# Patient Record
Sex: Female | Born: 1945
Health system: Southern US, Community
[De-identification: ages and names within clinical notes are randomized; demographics above are authoritative.]

## PROBLEM LIST (undated history)

## (undated) DIAGNOSIS — M1990 Unspecified osteoarthritis, unspecified site: Secondary | ICD-10-CM

## (undated) DIAGNOSIS — K219 Gastro-esophageal reflux disease without esophagitis: Secondary | ICD-10-CM

## (undated) DIAGNOSIS — I10 Essential (primary) hypertension: Secondary | ICD-10-CM

## (undated) DIAGNOSIS — F329 Major depressive disorder, single episode, unspecified: Secondary | ICD-10-CM

## (undated) DIAGNOSIS — M6530 Trigger finger, unspecified finger: Secondary | ICD-10-CM

## (undated) DIAGNOSIS — F32A Depression, unspecified: Secondary | ICD-10-CM

## (undated) DIAGNOSIS — F419 Anxiety disorder, unspecified: Secondary | ICD-10-CM

## (undated) HISTORY — PX: COLONOSCOPY: SHX174

## (undated) HISTORY — PX: BREAST LUMPECTOMY: SHX2

## (undated) HISTORY — PX: BREAST EXCISIONAL BIOPSY: SUR124

## (undated) HISTORY — PX: KNEE ARTHROSCOPY: SHX127

## (undated) HISTORY — PX: ABDOMINAL HYSTERECTOMY: SHX81

## (undated) HISTORY — PX: TONSILLECTOMY: SUR1361

---

## 1999-05-24 ENCOUNTER — Other Ambulatory Visit: Admission: RE | Admit: 1999-05-24 | Discharge: 1999-05-24 | Payer: Self-pay | Admitting: *Deleted

## 1999-06-02 ENCOUNTER — Ambulatory Visit (HOSPITAL_COMMUNITY): Admission: RE | Admit: 1999-06-02 | Discharge: 1999-06-02 | Payer: Self-pay | Admitting: *Deleted

## 1999-06-02 ENCOUNTER — Encounter: Payer: Self-pay | Admitting: *Deleted

## 1999-11-06 ENCOUNTER — Ambulatory Visit (HOSPITAL_COMMUNITY): Admission: RE | Admit: 1999-11-06 | Discharge: 1999-11-06 | Payer: Self-pay | Admitting: Family Medicine

## 1999-11-06 ENCOUNTER — Encounter: Payer: Self-pay | Admitting: Family Medicine

## 2000-11-21 ENCOUNTER — Other Ambulatory Visit: Admission: RE | Admit: 2000-11-21 | Discharge: 2000-11-21 | Payer: Self-pay | Admitting: *Deleted

## 2001-05-20 ENCOUNTER — Ambulatory Visit (HOSPITAL_COMMUNITY): Admission: RE | Admit: 2001-05-20 | Discharge: 2001-05-20 | Payer: Self-pay | Admitting: Gastroenterology

## 2001-05-20 ENCOUNTER — Encounter (INDEPENDENT_AMBULATORY_CARE_PROVIDER_SITE_OTHER): Payer: Self-pay | Admitting: Specialist

## 2002-02-25 ENCOUNTER — Other Ambulatory Visit: Admission: RE | Admit: 2002-02-25 | Discharge: 2002-02-25 | Payer: Self-pay | Admitting: *Deleted

## 2003-05-07 ENCOUNTER — Ambulatory Visit (HOSPITAL_COMMUNITY): Admission: RE | Admit: 2003-05-07 | Discharge: 2003-05-07 | Payer: Self-pay | Admitting: Gastroenterology

## 2003-05-07 ENCOUNTER — Encounter (INDEPENDENT_AMBULATORY_CARE_PROVIDER_SITE_OTHER): Payer: Self-pay | Admitting: Specialist

## 2003-06-07 ENCOUNTER — Other Ambulatory Visit: Admission: RE | Admit: 2003-06-07 | Discharge: 2003-06-07 | Payer: Self-pay | Admitting: *Deleted

## 2004-06-01 ENCOUNTER — Ambulatory Visit (HOSPITAL_COMMUNITY): Admission: RE | Admit: 2004-06-01 | Discharge: 2004-06-01 | Payer: Self-pay | Admitting: *Deleted

## 2004-06-07 ENCOUNTER — Encounter: Admission: RE | Admit: 2004-06-07 | Discharge: 2004-06-07 | Payer: Self-pay | Admitting: *Deleted

## 2004-06-26 ENCOUNTER — Other Ambulatory Visit: Admission: RE | Admit: 2004-06-26 | Discharge: 2004-06-26 | Payer: Self-pay | Admitting: *Deleted

## 2004-07-14 ENCOUNTER — Ambulatory Visit (HOSPITAL_COMMUNITY): Admission: RE | Admit: 2004-07-14 | Discharge: 2004-07-14 | Payer: Self-pay | Admitting: Gastroenterology

## 2004-07-14 ENCOUNTER — Encounter (INDEPENDENT_AMBULATORY_CARE_PROVIDER_SITE_OTHER): Payer: Self-pay | Admitting: Specialist

## 2004-09-15 ENCOUNTER — Ambulatory Visit (HOSPITAL_COMMUNITY): Admission: RE | Admit: 2004-09-15 | Discharge: 2004-09-15 | Payer: Self-pay | Admitting: Orthopedic Surgery

## 2004-09-15 ENCOUNTER — Ambulatory Visit (HOSPITAL_BASED_OUTPATIENT_CLINIC_OR_DEPARTMENT_OTHER): Admission: RE | Admit: 2004-09-15 | Discharge: 2004-09-15 | Payer: Self-pay | Admitting: Orthopedic Surgery

## 2004-10-02 ENCOUNTER — Ambulatory Visit (HOSPITAL_COMMUNITY): Admission: RE | Admit: 2004-10-02 | Discharge: 2004-10-02 | Payer: Self-pay | Admitting: Orthopedic Surgery

## 2005-06-08 ENCOUNTER — Ambulatory Visit (HOSPITAL_COMMUNITY): Admission: RE | Admit: 2005-06-08 | Discharge: 2005-06-08 | Payer: Self-pay | Admitting: *Deleted

## 2005-06-25 ENCOUNTER — Encounter: Payer: Self-pay | Admitting: *Deleted

## 2005-06-27 ENCOUNTER — Inpatient Hospital Stay (HOSPITAL_COMMUNITY): Admission: AD | Admit: 2005-06-27 | Discharge: 2005-06-30 | Payer: Self-pay | Admitting: *Deleted

## 2005-06-27 ENCOUNTER — Encounter (INDEPENDENT_AMBULATORY_CARE_PROVIDER_SITE_OTHER): Payer: Self-pay | Admitting: Specialist

## 2006-04-15 ENCOUNTER — Other Ambulatory Visit: Admission: RE | Admit: 2006-04-15 | Discharge: 2006-04-15 | Payer: Self-pay | Admitting: *Deleted

## 2006-07-01 ENCOUNTER — Encounter: Admission: RE | Admit: 2006-07-01 | Discharge: 2006-07-01 | Payer: Self-pay | Admitting: *Deleted

## 2006-12-26 ENCOUNTER — Encounter: Admission: RE | Admit: 2006-12-26 | Discharge: 2006-12-26 | Payer: Self-pay | Admitting: Orthopedic Surgery

## 2007-04-01 ENCOUNTER — Other Ambulatory Visit: Admission: RE | Admit: 2007-04-01 | Discharge: 2007-04-01 | Payer: Self-pay | Admitting: *Deleted

## 2007-06-17 ENCOUNTER — Encounter: Admission: RE | Admit: 2007-06-17 | Discharge: 2007-06-17 | Payer: Self-pay | Admitting: Orthopedic Surgery

## 2007-07-03 ENCOUNTER — Encounter: Admission: RE | Admit: 2007-07-03 | Discharge: 2007-07-03 | Payer: Self-pay | Admitting: *Deleted

## 2007-07-03 ENCOUNTER — Encounter: Admission: RE | Admit: 2007-07-03 | Discharge: 2007-07-03 | Payer: Self-pay | Admitting: Orthopedic Surgery

## 2007-07-30 ENCOUNTER — Encounter: Admission: RE | Admit: 2007-07-30 | Discharge: 2007-07-30 | Payer: Self-pay | Admitting: Orthopedic Surgery

## 2008-03-05 ENCOUNTER — Ambulatory Visit: Admission: RE | Admit: 2008-03-05 | Discharge: 2008-03-05 | Payer: Self-pay | Admitting: Orthopedic Surgery

## 2008-03-05 ENCOUNTER — Ambulatory Visit: Payer: Self-pay | Admitting: Surgery

## 2008-03-05 ENCOUNTER — Encounter (INDEPENDENT_AMBULATORY_CARE_PROVIDER_SITE_OTHER): Payer: Self-pay | Admitting: Orthopedic Surgery

## 2008-07-05 ENCOUNTER — Encounter: Admission: RE | Admit: 2008-07-05 | Discharge: 2008-07-05 | Payer: Self-pay | Admitting: Gynecology

## 2008-07-08 ENCOUNTER — Encounter: Admission: RE | Admit: 2008-07-08 | Discharge: 2008-07-08 | Payer: Self-pay | Admitting: Gynecology

## 2009-07-11 ENCOUNTER — Encounter: Admission: RE | Admit: 2009-07-11 | Discharge: 2009-07-11 | Payer: Self-pay | Admitting: Family Medicine

## 2010-04-27 ENCOUNTER — Encounter: Admission: RE | Admit: 2010-04-27 | Discharge: 2010-04-27 | Payer: Self-pay | Admitting: Family Medicine

## 2010-08-14 ENCOUNTER — Encounter
Admission: RE | Admit: 2010-08-14 | Discharge: 2010-08-14 | Payer: Self-pay | Source: Home / Self Care | Attending: Family Medicine | Admitting: Family Medicine

## 2010-09-16 ENCOUNTER — Encounter: Payer: Self-pay | Admitting: *Deleted

## 2010-09-17 ENCOUNTER — Encounter: Payer: Self-pay | Admitting: Gynecology

## 2010-09-17 ENCOUNTER — Encounter: Payer: Self-pay | Admitting: Family Medicine

## 2010-09-17 ENCOUNTER — Encounter: Payer: Self-pay | Admitting: Orthopedic Surgery

## 2011-01-12 NOTE — Op Note (Signed)
NAME:  Kimberly [name], Kimberly [name]                    ACCOUNT NO.:  [id number]   MEDICAL RECORD NO.:  [id number]                   PATIENT TYPE:  AMB   LOCATION:  ENDO                                 FACILITY:  [facility]   PHYSICIAN:  Petra Kuba, M.D.                 DATE OF BIRTH:  April 15, 1946   DATE OF PROCEDURE:  05/07/2003  DATE OF DISCHARGE:                                 OPERATIVE REPORT   PROCEDURE:  EGD with biopsy.   INDICATION:  The patient with atypical reflux.  Want to rule out Barrett's  or significant esophagitis.  Consent was signed after risks, benefits,  methods, options thoroughly discussed in the office.   MEDICINES USED:  1. Fentanyl 200 mcg.  2. Versed 10 mg.   DESCRIPTION OF PROCEDURE:  Video endoscope was inserted by direct vision.  A  quick look at the vocal cords was normal.  Maybe there was a touch or  erythema around them.  The scope passed into the esophagus which was normal  and advanced to the distal esophagus.  Possibly a tiny hiatal hernia was  seen.  The scope passed into the stomach, advanced in the antrum.  There was  some minimal antritis as well.  Scope passed into a normal pylorus, into a  normal duodenal bulb, and around the C-loop to a normal second portion of  the duodenum.  The scope was withdrawn back to the bulb.  A good look there  ruled out abnormalities in that location.  The scope was withdrawn back to  the stomach and retroflexed.  Angularis, cardia, fundus, greater and lesser  curve were all normal.  Straight visualization in the stomach was normal  except for the mild antritis.  Two biopsies of the antrum and two of the  proximal stomach were obtained to rule out Helicobacter.  Scope was slowly  withdrawn back to 20 cm.  No addition esophageal findings were seen, then  advanced to the distal esophagus, and a few biopsies of that area were  obtained to rule out any microscopic reflux problem.  The scope was then  slowly withdrawn.  The  patient tolerated the procedure well.  There was no  obvious immediate complication.   ENDOSCOPIC DIAGNOSES:  1. Tiny hiatal hernia.  2. Minimal antritis, status post biopsy of that and the proximal stomach.  3. Otherwise normal EGD, status post distal esophageal biopsies.    PLAN:  1. We will go ahead and double her Nexium.  2. Follow up in two months and await path in the meantime.  3. Call me sooner p.r.n.                                               Petra Kuba, M.D.    MEM/MEDQ  D:  05/07/2003  T:  05/07/2003  Job:  [id number]   cc:   Jetty Duhamel. at [facility] B. Sherene Sires, M.D. [facility]

## 2011-01-12 NOTE — Op Note (Signed)
NAMEGALILEE, Kimberly [name]          ACCOUNT NO.:  [id number]   MEDICAL RECORD NO.:  [id number]          PATIENT TYPE:  AMB   LOCATION:  ENDO                         FACILITY:  [facility]   PHYSICIAN:  Petra Kuba, M.D.    DATE OF BIRTH:  1946/03/22   DATE OF PROCEDURE:  07/14/2004  DATE OF DISCHARGE:                                 OPERATIVE REPORT   PROCEDURE:  Colonoscopy with biopsy.   INDICATION:  Family history of colon cancer.  __________ due for colonic  screening.  Consent was signed after risks, benefits, methods, options  thoroughly discussed multiple times in the past.   MEDICINES USED:  1.  Demerol 80.  2.  Versed 9.   DESCRIPTION OF PROCEDURE:  Rectal inspection was pertinent for external  hemorrhoids.  Digital exam was negative.  The video pediatric adjustable  colonoscope was inserted, easily advanced around the colon to the cecum,  which required rolling her on her back and some abdominal pressure.  Other  than some left and right diverticula, three tiny left-sided and transverse  probable hyperplastic-appearing polyps were seen on insertion and were all  cold biopsied and put in the first container.  The cecum was identified by  the appendiceal orifice and the ileocecal valve.  The prep was adequate.  There was some liquid stool that required washing and suctioning on slow  withdrawal through the colon.  The cecum and the ascending were normal.  As  we withdrew around the left side of the colon, the polyps that were seen on  insertion sites were seen.  If there was any residual polypoid tissue seen,  an extra biopsy was obtained.  A few other hyperplastic-appearing left-sided  polyps were seen and were also cold biopsied.  None of the polyps appeared  worrisome or anything but hyperplastic, so they were all cold biopsied and  put in the same container.  There was a rare left-sided and right-sided  diverticula but no other abnormalities.  Once back in the rectum,  anorectal  pull-through and retroflexion confirmed some small hemorrhoids.  The scope  was straightened and readvanced a short ways up the left side of the colon;  air was suctioned and scope removed.  The patient tolerated the procedure  well.  There was no obvious immediate complication.   ENDOSCOPIC DIAGNOSES:  1.  Internal-external small hemorrhoids.  2.  Scattered occasional left and right diverticula.  3.  Few left-sided and transverse hyperplastic-appearing polyps, cold      biopsied.  4.  Otherwise within normal limits to the cecum.   PLAN:  1.  Await pathology to determine future colonic screening.  2.  Happy to see back p.r.n.  3.  Otherwise, return care to [facility] and Dr. Melba Coon for the customary health care maintenance to include yearly rectals      and guaiacs.      MEM/MEDQ  D:  07/14/2004  T:  07/14/2004  Job:  [id number]   cc:   Chales Salmon. Abigail Miyamoto, M.D.  [address]  [place]  [place] [address]  Fax: [phone]   Almedia Balls. Fore, M.D.  [phone] [address] [place]  [place] [address]  Fax: [phone]

## 2011-01-12 NOTE — Op Note (Signed)
NAMEDENIM, START          ACCOUNT NO.:  [id number]   MEDICAL RECORD NO.:  [id number]          PATIENT TYPE:  INP   LOCATION:  9304                          FACILITY:  [facility]   PHYSICIAN:  Almedia Balls. Fore, M.D.   DATE OF BIRTH:  1945/08/30   DATE OF PROCEDURE:  06/27/2005  DATE OF DISCHARGE:                                 OPERATIVE REPORT   PREOPERATIVE DIAGNOSIS:  Uterovaginal prolapse, cystocele, rectocele,  probable enterocele.   POSTOPERATIVE DIAGNOSIS:  Uterovaginal prolapse, cystocele, rectocele,  probable enterocele. Pending pathology.   OPERATION:  Laparoscopically assisted vaginal hysterectomy, bilateral  salpingo-oophorectomy, anterior and posterior colporrhaphy, repair of  enterocele,  uterosacral ligament vaginal vault suspension.   ANESTHESIA:  General orotracheal.   OPERATOR:  Almedia Balls. Randell Patient, M.D.   FIRST ASSISTANT:  Leona Singleton, M.D.   INDICATIONS FOR SURGERY:  The patient is a 65 year old with the above-noted  problems who was counseled as to the need for surgery to treat these  problems. She was fully counseled as to the nature of the procedure and the  risks involved to include risks of anesthesia, injury to bowel, bladder,  blood vessels, ureters, postoperative hemorrhage, infection, recuperation.  She fully understands all these considerations and has signed informed  consent to proceed on June 27, 2005.   OPERATIVE FINDINGS:  On examination, the uterus was mid posterior normal  size, shape and contour. There were no palpable adnexal masses. There was  descent of the uterus to the level of the introitus. On laparoscopy, the  lower liver edge, area of the gallbladder, spleen, and appendiceal area were  normal. The uterus, tubes and ovaries were normal in the pelvis as well.   PROCEDURE:  With the patient under general anesthesia, prepared and draped  in the usual sterile fashion, a speculum was placed in the vagina, the  cervix was grasped with a  hulka tenaculum. A Foley catheter was placed, and  the bladder was filled with 500 mL of normal saline solution. A Bonanno  suprapubic catheter was placed in the bladder without difficulty and sutured  in place with #0 silk sutures. The Foley catheter was then left to straight  drainage. The patient was then re-prepared and draped for a laparoscopic  procedure. An incision was made in the lower pole of the umbilicus with  insertion of the Veress cannula and insufflation of 3 liters of carbon  dioxide. The disposable 11 mm trocar for the operative scope and the scope  itself were then inserted into the peritoneal cavity. A disposable 5 mm  probe was inserted through a stab wound just above the symphysis pubis above  the area of the suprapubic catheter and well above the reflection of the  bladder. The above-noted findings were visualized. Bipolar  electrocoagulation was used for rendering the infundibulopelvic ligaments  bilaterally hemostatic as well as the  attachments and round ligaments  bilaterally. These structures were sequentially cut free. There was no  bleeding encountered during this portion of the procedure. The bladder flap  was developed on the anterior surface of the uterus with bipolar  electrocoagulation for hemostasis and sharp dissection  for development of  the planes. At this point with good hemostasis in the peritoneal area,  attention was directed to the vaginal portion of the procedure. The patient  was repositioned, and a weighted speculum was placed in the posterior vagina  with the cervix being grasped with two Loman [place]. A solution of 1%  lidocaine with 1:100,000 epinephrine was injected circumferentially on the  cervix for a total of 10 mL for delineation of fascial planes and  hemostasis. An incision was made in the vaginal mucosa anteriorly from the 3  to 9 o'clock position with sharp dissection being utilized to dissect the  bladder off the cervix and  lower uterine segment. This was accomplished  without difficulty, and the anterior cul-de-sac was entered without  difficulty. The posterior cul-de-sac was entered by making an incision  through the vaginal mucosa with entry into the posterior cul-de-sac. Heaney  clamps were used to clamp in succession and bilaterally the uterosacral  ligament, vaginal cuff angles, cardinal ligaments and uterine vessels. The  structures were sequentially cut free and individually suture ligated with  #1 chromic catgut. It was then possible to invert the uterus. Heaney clamps  were used to clamp the remaining portions of cardinal ligaments bilaterally  which were then cut free with removal of the uterus, tubes, and ovaries as a  single specimen. The cardinal ligament clamps were then tied with  interrupted ties of #1 chromic catgut. After inspecting the area and  ensuring that sponge and instrument counts were correct, the peritoneum was  closed with a continuous suture of #0 Vicryl. Attention was directed to the  anterior colporrhaphy. Allis clamps were placed at the vaginal cuff angles  in the upper vagina with other Allis clamps placed in the midline. The  lidocaine epinephrine solution was used in the midline to again dissect the  fascial planes and provide for hemostasis. An incision was made from the  vaginal cuff to the urethrovesical angle. The underlying bladder fascia was  dissected off using sharp and blunt dissection. Imbricating sutures of #0  Vicryl were placed in the urethrovesical angle area with good elevation of  this angle. The cystocele was reduced using imbricated pursestring sutures  and horizontal mattress sutures which provided good elevation of the  bladder. Redundant vaginal mucosa was trimmed, and the vaginal mucosa was  then reapproximated and rendered hemostatic with a continuous interlocking suture of #0 Vicryl. Attention was directed to the rectocele and enterocele  areas  next.   Allis clamps were used to provide elevation for the vaginal mucosa which  again was injected in the midline with the lidocaine epinephrine solution. A  total of 11 mL were used on both the anterior and posterior repairs. The  posterior vaginal mucosa was then incised in the midline from the introitus  to the vaginal cuff. Underlying rectus fascia and levator ani fascia was  dissected free of the vaginal mucosa. The enterocele was encountered in the  upper portion of the dissection plane. The enterocele was then reduced by  imbricating sutures of #0 Vicryl with good resolution of this problem.  Interrupted horizontal mattress sutures were used to reattach the detached  ends of the levator ani fascia. The supporting vaginal fascial areas were  brought together in the midline and using again imbricating sutures, the  rectocele was also reduced. Redundant vaginal mucosa in the posterior wall  was then excised, and the posterior incision was reapproximated and rendered  hemostatic with a continuous interlocking suture  of #0 Vicryl. The upper  vaginal cuff was inspected for hemostasis and small bleeders were rendered  hemostatic with Bovie electrocoagulation. The uterosacral ligament sutures  which had been held long were tied together in the midline. A suture of #0  PDS had been placed in the upper vaginal cuff with incorporation of the  uterosacral ligaments bilaterally for vault suspension. This was then tied  down with good elevation of the vaginal vault. The area was lavaged  with  copious amounts of lactated Ringer solution, and after noting that  hemostasis in this area was maintained and that sponge and instrument counts  was were correct, the patient was repositioned for a laparoscopic procedure.   The laparoscope was reinserted into the trocar which had been left in place.  The operative site in the pelvis was lavaged with copious amounts of  lactated Ringer solution. Small  bleeders were rendered hemostatic with  bipolar electrocoagulation. The area was observed following use of the  bipolar unit for hemostasis, and after noting this was maintained and that  sponge and instrument counts were correct, the laparoscope and lower trocars  were removed from the peritoneal cavity while allowing gas to escape. The  incisions were closed with fascial sutures of #0 Vicryl  and subcuticular sutures of 3-0 plain catgut. Estimated blood loss for the  entire procedure 150 mL. The patient was taken to the recovery room in good  condition with clear urine in the suprapubic catheter tubing. She will be  admitted following surgery.           ______________________________  Almedia Balls Randell Patient, M.D.     SRF/MEDQ  D:  06/27/2005  T:  06/27/2005  Job:  [id number]   cc:   Leona Singleton, M.D.  Fax: [phone]

## 2011-01-12 NOTE — Procedures (Signed)
[place]  Patient:    Kimberly [name], Kimberly [name] Visit Number: [id number] MRN: [id number]          Service Type: END Location: ENDO Attending Physician:  Nelda Marseille Dictated by:   Petra Kuba, M.D. Proc. Date: 05/20/01 Admit Date:  05/20/2001   CC:         Chales Salmon. Abigail Miyamoto, M.D.   Procedure Report  PROCEDURE:  Colonoscopy with polypectomy.  INDICATIONS FOR PROCEDURE:  The patient with a bout of gastrointestinal bleeding, questionable etiology, overdue for colonic screening.  Consent was signed after risks, benefits, methods, and options were thoroughly discussed multiple times in the past.  MEDICATIONS USED: 1. Demerol 90 mg. 2. Versed 9 mg.  DESCRIPTION OF PROCEDURE:  Rectal inspection was pertinent for external hemorrhoids.  Digital examination was negative.  Pediatric video colonoscope was inserted, fairly easily advanced around the colon to the cecum.  This did require some left lower quadrant pressure, but no position changes.  Upon insertion, there was some rectal inflammation, but no other abnormalities.  No diverticuli were seen.  The cecum was identified by the appendiceal orifice and the ileocecal valve, in fact, the scope was inserted a short way in the terminal ileum which was normal.  Photo documentation was obtained.  The scope was slowly withdrawn.  The prep was adequate.  There was some liquid stool that required washing and suctioning.  The cecum was normal.  In the proximal ascending, a 2 to 3 mm polyp was seen, snared, electrocautery applied, and the polyp was suctioned through the scope and collected in the trap.  In the more distal ascending, a tiny 2 mm polyp was seen and hot biopsied x 1.  Scope was slowly withdrawn.  In the transverse, a 1 mm polyp was seen and was hot biopsied x 1 as well.  The scope was further withdrawn around the left side of the colon.  The descending colon was normal.  In the distal  sigmoid and rectum, multiple tiny hyperplastic appearing polyps were seen which were hot biopsied and put in the same container.  Back in the rectum, multiple cold biopsies were obtained of the rectal inflammation which was mild.  It actually looked more of a resolving ischemia than any other obvious etiologies.  The scope was then retroflexed revealing some small internal hemorrhoids.  The scope was straightened, readvanced a short ways up the sigmoid, air was suctioned, scope removed.  The patient tolerated the procedure well.  There were no obvious immediate complications.  ENDOSCOPIC DIAGNOSES: 1. Internal external hemorrhoids. 2. Rectal minimal inflammation, status post biopsy. 3. Three ascending and transverse small polyps, one snared, two hot biopsied. 4. Multiple tiny rectal and sigmoid hyperplastic appearing polyps that were    hot biopsied. 5. Otherwise within normal limits to the terminal ileum.  PLAN: 1. Await pathology to determine future colonic screening. 2. Post-polypectomy instructions. 3. Gastrointestinal followup p.r.n. or in 6 to 8 weeks to recheck guaiac    symptoms, and make sure no further workup plans are needed. Dictated by:   Petra Kuba, M.D. Attending Physician:  Nelda Marseille DD:  05/20/01 TD:  05/20/01 Job: [id number]

## 2011-01-12 NOTE — Op Note (Signed)
NAMEJASLINE, Kimberly [name]          ACCOUNT NO.:  [id number]   MEDICAL RECORD NO.:  [id number]          PATIENT TYPE:  AMB   LOCATION:  DSC                          FACILITY:  [facility]   PHYSICIAN:  Thera Flake., M.D.DATE OF BIRTH:  [birth year]   DATE OF PROCEDURE:  09/15/2004  DATE OF DISCHARGE:                                 OPERATIVE REPORT   PREOPERATIVE DIAGNOSIS:  Right knee pain with torn medial meniscus.   POSTOPERATIVE DIAGNOSIS:  Right knee pain with torn medial meniscus, torn  lateral meniscus, osteoarthritis of the patellofemoral joint and medial  compartment.   OPERATION:  1.  Partial medial and lateral meniscectomies.  2.  Debridement chondroplasty of patellofemoral joint medial compartment.   SURGEON:  Dyke Brackett, M.D.   ANESTHESIA:  General.   DESCRIPTION OF PROCEDURE:  Arthroscope through an inferomedial and  inferolateral portal.  Moderate chondromalacia of the patella was identified  with debridement of the patellofemoral joint separate from the medial  compartment.  The medial meniscus showed a complex tear of the posterior  horn of the meniscus requiring resection of about 30-40% of the meniscus  substance with debridement of the condyle, ACL and PCL were normal, the  lateral meniscus showed a small fraying type tear of the lateral meniscus  which was excised.  No significant degenerative arthritis was present  laterally.  The knee was drained free of fluid.  The portals were closed  with nylon.  The knee was infiltrated with Marcaine and morphine and an  additional 40 mg Depo-Medrol.  The patient went to the recovery room in  stable condition.      WDC/MEDQ  D:  09/15/2004  T:  09/15/2004  Job:  [id number]

## 2011-01-12 NOTE — H&P (Signed)
Kimberly [name], Kimberly [name]          ACCOUNT NO.:  [id number]   MEDICAL RECORD NO.:  [id number]           PATIENT TYPE:   LOCATION:                                 FACILITY:   PHYSICIAN:  Almedia Balls. Fore, M.D.   DATE OF BIRTH:  [birth year]   DATE OF ADMISSION:  06/27/2005  DATE OF DISCHARGE:                                HISTORY & PHYSICAL   CHIEF COMPLAINTS:  Something dropping.   HISTORY:  The patient is a 65 year old, gravida 2, para 2 whose last  menstrual period was many years ago. She was seen recently in our office for  examination with a complaint of something hanging out. This has increased  over the past several months. She has been doing Kegel exercises regularly  and states that she has had no incontinence. Examination at that time  revealed moderate severe cystocele, moderate rectocele and probable moderate  enterocele as well. She was counseled as to the nature of her problem and  the methods of treatment which would be hysterectomy, bilateral salpingo-  oophorectomy, anterior-and posterior colporrhaphy and enterocele repair.  This was explained fully to her to include the procedure and full risks of  anesthesia, injury to bowel, bladder, ureters, vessels, postoperative  hemorrhage, infection, recuperation use of a catheter following surgery. She  fully understands all these considerations and wishes to proceed on June 27, 2005. Pap smear has been normal within the past year.   PAST MEDICAL HISTORY:  The patient has been maintained on furosemide 40 mg  for persistent peripheral edema. Recent potassium check revealed this to be  low at 3.2 on June 11, 2005. It was suggested that she is to discontinue  the furosemide and has been placed on K-Dur 20 mEq tablets 1 b.i.d. to  improve her potassium which will be rechecked prior to surgery. Otherwise,  the patient is in good health.   FAMILY HISTORY:  Brother with colon cancer for which the patient was checked  with  colonoscopy in November 2005. This examination was normal. She also  underwent a cardiology examination in the past because of elevated  cholesterol and underwent multiple studies with no findings of coronary  artery disease.   REVIEW OF SYSTEMS:  HEENT: Negative. CARDIORESPIRATORY:  Negative.  GASTROINTESTINAL: Negative. GENITOURINARY: As in present illness.  NEUROMUSCULAR:  Negative.   PHYSICAL EXAMINATION:  VITAL SIGNS:  Height 5 feet 2 1/4 inches, weight 125  pounds, blood pressure 118/70, pulse 84, respirations 18.  GENERAL:  A well-developed, white female in no acute distress.  HEENT:  Within normal limits.  NECK:  Supple without masses, adenopathy or bruits.  HEART:  Regular rate and rhythm without murmurs.  LUNGS:  Clear to P&A.  BREASTS: Examined sitting and lying without mass. Axilla negative. ABDOMEN:  Flat and soft without mass, nontender.  PELVIC:  External Genitalia, Bartholin's, urethra and Skene's glands within  normal limits. Vagina is atrophic and reveals a moderate to severe  cystocele, moderate rectocele and with rectovaginal exam a probable moderate  enterocele as well.  EXTREMITIES:  Within normal limits.  CENTRAL NERVOUS SYSTEM:  Grossly intact.  SKIN:  Without suspicious lesions.   IMPRESSION:  Pelvic outlet prolapse with cystocele, rectocele, enterocele.   DISPOSITION:  Admit for the above-noted surgery.           ______________________________  Almedia Balls Randell Patient, M.D.     SRF/MEDQ  D:  06/20/2005  T:  06/20/2005  Job:  [id number]

## 2011-01-12 NOTE — Discharge Summary (Signed)
Kimberly [name], Kimberly [name]          ACCOUNT NO.:  [id number]   MEDICAL RECORD NO.:  [id number]          Kimberly [name] TYPE:  INP   LOCATION:  9304                          FACILITY:  [facility]   PHYSICIAN:  Almedia Balls. Fore, M.D.   DATE OF BIRTH:  [birth year]   DATE OF ADMISSION:  06/27/2005  DATE OF DISCHARGE:  06/30/2005                                 DISCHARGE SUMMARY   HISTORY:  The Kimberly [name] is a 65 year old with pelvic organ prolapse with  moderate to severe cystocele, rectocele, and probable enterocele.  The  remainder of her history and physical are as previously dictated.  Laboratory data include preoperative hemoglobin 12.5.  Chest x-ray showed  some chronic obstructive pulmonary disease changes but no acute changes.  Electrocardiogram essentially within normal limits.   HOSPITAL COURSE:  The Kimberly [name] was taken to the operating room on 27 June 2005 at which time laparoscopically assisted vaginal hysterectomy, bilateral  salpingo-oophorectomy, anterior and posterior colporrhaphy, repair of  enterocele, and uterosacral ligament vaginal vault suspension were  performed.  The Kimberly [name] did well postoperatively.  She had had a suprapubic  catheter placed at the time of surgery as well.  She began voiding fairly  well on 29 June 2005 but still had residuals over 100.  It was felt that  she should continue with a suprapubic catheter in the hospital.  On the  morning of 30 June 2005, she was afebrile and experiencing no problems  except for pain which was controlled by oral analgesics.  It was felt that  she could be discharged at this time.   FINAL DIAGNOSES:  1.  Pelvic organ prolapse.  2.  Cystocele.  3.  Rectocele.  4.  Enterocele.  5.  Uterovaginal prolapse.   OPERATION:  1.  Laparoscopically-assisted vaginal hysterectomy.  2.  Bilateral salpingo-oophorectomy, anterior and posterior colporrhaphy.  3.  Enterocele repair.  4.  Uterosacral ligament vaginal vault suspension.   Pathology report showed small leiomyomata but otherwise totally benign  changes.   DISPOSITION:  Discharged home to return to the office in 2 weeks for follow  up or when she is able to void with a residual less than 60 mL twice in a 12  hour period.  She was instructed to gradually progress her activities over  several weeks and to limit lifting and driving for 2 weeks.  She was fully  ambulatory, on a regular diet, and in good condition at the time of  discharge.  She will be on a prescription for [employer] generic #30 to  be taken 1 q.4-6h. p.r.n. pain and doxycycline 100 mg #12 to be taken 1  twice daily.           ______________________________  Almedia Balls Randell Kimberly [name], M.D.     SRF/MEDQ  D:  06/30/2005  T:  06/30/2005  Job:  [id number]

## 2011-05-28 ENCOUNTER — Other Ambulatory Visit: Payer: Self-pay | Admitting: Dermatology

## 2011-07-02 ENCOUNTER — Other Ambulatory Visit: Payer: Self-pay | Admitting: Family Medicine

## 2011-07-02 DIAGNOSIS — Z1231 Encounter for screening mammogram for malignant neoplasm of breast: Secondary | ICD-10-CM

## 2011-07-06 ENCOUNTER — Emergency Department (HOSPITAL_COMMUNITY): Payer: Medicare Other

## 2011-07-06 ENCOUNTER — Other Ambulatory Visit: Payer: Self-pay

## 2011-07-06 ENCOUNTER — Observation Stay (HOSPITAL_COMMUNITY)
Admission: EM | Admit: 2011-07-06 | Discharge: 2011-07-08 | Disposition: A | Discharge status: Home / Self Care | Payer: Medicare Other | Attending: Internal Medicine | Admitting: Internal Medicine

## 2011-07-06 DIAGNOSIS — F411 Generalized anxiety disorder: Secondary | ICD-10-CM | POA: Insufficient documentation

## 2011-07-06 DIAGNOSIS — I10 Essential (primary) hypertension: Secondary | ICD-10-CM | POA: Diagnosis present

## 2011-07-06 DIAGNOSIS — E785 Hyperlipidemia, unspecified: Secondary | ICD-10-CM | POA: Diagnosis present

## 2011-07-06 DIAGNOSIS — M549 Dorsalgia, unspecified: Secondary | ICD-10-CM | POA: Insufficient documentation

## 2011-07-06 DIAGNOSIS — R002 Palpitations: Secondary | ICD-10-CM | POA: Insufficient documentation

## 2011-07-06 DIAGNOSIS — Z8249 Family history of ischemic heart disease and other diseases of the circulatory system: Secondary | ICD-10-CM | POA: Insufficient documentation

## 2011-07-06 DIAGNOSIS — Z79899 Other long term (current) drug therapy: Secondary | ICD-10-CM | POA: Insufficient documentation

## 2011-07-06 DIAGNOSIS — R0789 Other chest pain: Principal | ICD-10-CM | POA: Insufficient documentation

## 2011-07-06 DIAGNOSIS — R079 Chest pain, unspecified: Secondary | ICD-10-CM | POA: Diagnosis present

## 2011-07-06 DIAGNOSIS — M1990 Unspecified osteoarthritis, unspecified site: Secondary | ICD-10-CM

## 2011-07-06 HISTORY — DX: Unspecified osteoarthritis, unspecified site: M19.90

## 2011-07-06 LAB — COMPREHENSIVE METABOLIC PANEL
AST: 22 U/L (ref 0–37)
Albumin: 4.1 g/dL (ref 3.5–5.2)
BUN: 20 mg/dL (ref 6–23)
Creatinine, Ser: 0.79 mg/dL (ref 0.50–1.10)
Total Protein: 7.2 g/dL (ref 6.0–8.3)

## 2011-07-06 LAB — D-DIMER, QUANTITATIVE: D-Dimer, Quant: 0.35 ug/mL-FEU (ref 0.00–0.48)

## 2011-07-06 LAB — PROTIME-INR
INR: 0.9 (ref 0.00–1.49)
Prothrombin Time: 12.3 seconds (ref 11.6–15.2)

## 2011-07-06 LAB — CBC
HCT: 39.9 % (ref 36.0–46.0)
Hemoglobin: 13.3 g/dL (ref 12.0–15.0)
MCHC: 33.3 g/dL (ref 30.0–36.0)
MCV: 93.9 fL (ref 78.0–100.0)
WBC: 4.9 10*3/uL (ref 4.0–10.5)

## 2011-07-06 LAB — POCT I-STAT TROPONIN I: Troponin i, poc: 0 ng/mL (ref 0.00–0.08)

## 2011-07-06 LAB — DIFFERENTIAL
Basophils Absolute: 0 10*3/uL (ref 0.0–0.1)
Basophils Relative: 0 % (ref 0–1)
Eosinophils Absolute: 0.1 10*3/uL (ref 0.0–0.7)
Monocytes Absolute: 0.5 10*3/uL (ref 0.1–1.0)
Monocytes Relative: 11 % (ref 3–12)
Neutrophils Relative %: 59 % (ref 43–77)

## 2011-07-06 LAB — APTT: aPTT: 30 seconds (ref 24–37)

## 2011-07-06 MED ORDER — SODIUM CHLORIDE 0.9 % IJ SOLN: 3.0000 mL | INTRAMUSCULAR | Status: DC | PRN

## 2011-07-06 MED ORDER — ASPIRIN EC 325 MG PO TBEC
325.0000 mg | DELAYED_RELEASE_TABLET | Freq: Every day | ORAL | Status: DC
  Administered 2011-07-06 – 2011-07-08 (×3): 325 mg via ORAL
  Filled 2011-07-06 (×3): qty 1

## 2011-07-06 MED ORDER — ACETAMINOPHEN 325 MG PO TABS: 650.0000 mg | ORAL_TABLET | Freq: Four times a day (QID) | ORAL | Status: DC | PRN

## 2011-07-06 MED ORDER — MORPHINE SULFATE 2 MG/ML IJ SOLN: 1.0000 mg | INTRAMUSCULAR | Status: DC | PRN

## 2011-07-06 MED ORDER — HYDROCODONE-ACETAMINOPHEN 5-325 MG PO TABS: 1.0000 | ORAL_TABLET | ORAL | Status: DC | PRN

## 2011-07-06 MED ORDER — METOPROLOL TARTRATE 25 MG PO TABS
25.0000 mg | ORAL_TABLET | Freq: Two times a day (BID) | ORAL | Status: DC
  Administered 2011-07-06 – 2011-07-08 (×4): 25 mg via ORAL
  Filled 2011-07-06 (×5): qty 1

## 2011-07-06 MED ORDER — SODIUM CHLORIDE 0.9 % IV SOLN
INTRAVENOUS | Status: DC
  Administered 2011-07-06: 17:00:00 via INTRAVENOUS

## 2011-07-06 MED ORDER — PANTOPRAZOLE SODIUM 40 MG PO TBEC
DELAYED_RELEASE_TABLET | ORAL | Status: AC
  Filled 2011-07-06: qty 1

## 2011-07-06 MED ORDER — ASPIRIN 81 MG PO CHEW
324.0000 mg | CHEWABLE_TABLET | Freq: Once | ORAL | Status: DC
  Filled 2011-07-06: qty 4

## 2011-07-06 MED ORDER — ACETAMINOPHEN 650 MG RE SUPP: 650.0000 mg | Freq: Four times a day (QID) | RECTAL | Status: DC | PRN

## 2011-07-06 MED ORDER — ASPIRIN 81 MG PO TABS
324.0000 mg | ORAL_TABLET | Freq: Once | ORAL | Status: DC
  Administered 2011-07-06: 324 mg via ORAL

## 2011-07-06 MED ORDER — PANTOPRAZOLE SODIUM 40 MG PO TBEC
40.0000 mg | DELAYED_RELEASE_TABLET | Freq: Every day | ORAL | Status: DC
  Administered 2011-07-06 – 2011-07-08 (×3): 40 mg via ORAL
  Filled 2011-07-06 (×2): qty 1

## 2011-07-06 MED ORDER — LABETALOL HCL 5 MG/ML IV SOLN
5.0000 mg | INTRAVENOUS | Status: DC | PRN
  Filled 2011-07-06: qty 4

## 2011-07-06 MED ORDER — ENOXAPARIN SODIUM 40 MG/0.4ML ~~LOC~~ SOLN
40.0000 mg | SUBCUTANEOUS | Status: DC
  Administered 2011-07-06: 40 mg via SUBCUTANEOUS
  Filled 2011-07-06 (×2): qty 0.4

## 2011-07-06 MED ORDER — NITROGLYCERIN 0.4 MG SL SUBL: 0.4000 mg | SUBLINGUAL_TABLET | SUBLINGUAL | Status: DC | PRN

## 2011-07-06 NOTE — ED Notes (Signed)
Clean catch urine specimen collected and sent down to lab for possible in the future.

## 2011-07-06 NOTE — ED Notes (Signed)
4E CALLED, UNABLE TO TAKE REPORT AT THIS TIME

## 2011-07-06 NOTE — H&P (Signed)
PCP:   Mickie Hillier, MD   Chief Complaint:  Chest pain  HPI: This is a 65 y/o female with no co-morbidities, who developed chest [place]. She was asleep and awoke from sleep. Chest pain was centrally located, radiated to the back. Described as sharp, constant, at its worse 8/10. Pain last all night Tuesday, went away at [facility]. She had a bit of pain Wednesday and today. Returned home today saw her MD who sent her to the ER. She reports some palpitations. No SOB, no diaphoresis, no LE edema, no h/o CAD. No GERD, some anxiety. She had a stress test once approximately 6 years ago. It was normal. Here her BP was quite elevated, she does not have a h/o HTN.   Review of Systems: (positive bolded) The patient denies anorexia, fever, weight loss,, vision loss, decreased hearing, hoarseness, chest pain, syncope, dyspnea on exertion, peripheral edema, balance deficits, hemoptysis, abdominal pain, melena, hematochezia, severe indigestion/heartburn, hematuria, incontinence, genital sores, muscle weakness, suspicious skin lesions, transient blindness, difficulty walking, depression, unusual weight change, abnormal bleeding, enlarged lymph nodes, angioedema, and breast masses.  Past Medical History: History reviewed. No pertinent past medical history. History reviewed. No pertinent past surgical history.  Medications: Prior to Admission medications   Medication Sig Start Date End Date Taking? Authorizing Provider  cholecalciferol (VITAMIN D) 1000 UNITS tablet Take 1,000 Units by mouth daily.     Yes Historical Provider, MD  Melatonin 1 MG CAPS Take 1 mg by mouth daily.     Yes Historical Provider, MD  Multiple Vitamin (MULTIVITAMIN) tablet Take 1 tablet by mouth daily.     Yes Historical Provider, MD  sertraline (ZOLOFT) 50 MG tablet Take 50 mg by mouth daily.     Yes Historical Provider, MD  thiamine (VITAMIN B-1) 100 MG tablet Take 100 mg by mouth daily.     Yes  Historical Provider, MD    Allergies:   Allergies  Allergen Reactions  . Flexeril (Cyclobenzaprine Hcl) Other (See Comments)    Violently ill  . Penicillins Other (See Comments)    unsure  . Skelaxin Nausea And Vomiting    Social History:  does not have a smoking history on file. She does not have any smokeless tobacco history on file. She reports that she does not drink alcohol. Her drug history not on file. married, lives with husband, not on home oxygen, does not use a walker or a cane.  Family History: Family History  Problem Relation Age of Onset  . Coronary artery disease Father     Physical Exam: Filed Vitals:   07/06/11 1548  BP: 201/67  Pulse: 69  Temp: 98.4 F (36.9 C)  TempSrc: Oral  Resp: 16  SpO2: 98%   General:  Alert and oriented times three, well developed and nourished, no acute distress Eyes: PERRLA, pink conjunctiva, no scleral icterus ENT: Moist oral mucosa, neck supple, no thyromegaly Lungs: clear to ascultation, no wheeze, no crackles, no use of accessory muscles Cardiovascular: regular rate and rhythm, no regurgitation, no gallops, no murmurs. No carotid bruits, no JVD Abdomen: soft, positive BS, non-tender, non-distended, no organomegaly, not an acute abdomen GU: not examined Neuro: CN II - XII grossly intact, sensation intact Musculoskeletal: strength 5/5 all extremities, no clubbing, cyanosis or edema, no reproducible chest wall pain Skin: no rash, no subcutaneous crepitation, no decubitus Psych: appropriate patient   Labs on Admission:   Basename 07/06/11 1630  NA 135  K 3.6  CL 99  CO2 26  GLUCOSE 95  BUN 20  CREATININE 0.79  CALCIUM 9.5  MG --  PHOS --    Basename 07/06/11 1630  AST 22  ALT 16  ALKPHOS 65  BILITOT 0.3  PROT 7.2  ALBUMIN 4.1   No results found for this basename: LIPASE:2,AMYLASE:2 in the last 72 hours  Basename 07/06/11 1630  WBC 4.9  NEUTROABS 2.9  HGB 13.3  HCT 39.9  MCV 93.9  PLT 183   No  results found for this basename: CKTOTAL:3,CKMB:3,CKMBINDEX:3,TROPONINI:3 in the last 72 hours No results found for this basename: TSH,T4TOTAL,FREET3,T3FREE,THYROIDAB in the last 72 hours No results found for this basename: VITAMINB12:2,FOLATE:2,FERRITIN:2,TIBC:2,IRON:2,RETICCTPCT:2 in the last 72 hours  Radiological Exams on Admission: Dg Chest Portable 1 View  07/06/2011  *RADIOLOGY REPORT*  Clinical Data: Chest pain  PORTABLE CHEST - 1 VIEW  Comparison: Chest radiograph 06/25/2005  Findings: Normal mediastinum and cardiac silhouette.  Normal pulmonary  vasculature.  No evidence of effusion, infiltrate, or pneumothorax.  No acute bony abnormality.  IMPRESSION: No acute cardiopulmonary process.  Original Report Authenticated By: Genevive Bi, M.D.   EKG: NSR  Assessment/Plan Present on Admission:  .Chest pain .HTN (hypertension) - new diagnosis -admit to observation -cycle cardiac enzymes -lipid panel in AM -start metoprolol, PRN labetalol -ASA, nitro  Code status: full code DVT/GI prophylaxis Dr Janee Morn to assume care in AM    Kimberly [name] 07/06/2011, 7:39 PM

## 2011-07-06 NOTE — ED Notes (Signed)
Pt complains of chest pain for two days, worse on Tuesday, today the pain never went away, went to her primary Dr and he sent her here

## 2011-07-06 NOTE — ED Notes (Signed)
Sandwich and fruit cup provided

## 2011-07-06 NOTE — ED Notes (Signed)
NFA:OZ30<QM> Expected date:07/06/11<BR> Expected time: 3:22 PM<BR> Means of arrival:Ambulance<BR> Comments:<BR> M31 - 65yo Chest Pain for 2 days.

## 2011-07-06 NOTE — ED Notes (Signed)
EMS gave 324 mg aspirin and started an IV in the right AC 20g

## 2011-07-06 NOTE — ED Notes (Signed)
Pt with no complaints, watching TV

## 2011-07-06 NOTE — ED Notes (Signed)
Updated pt on plan for cardiology to consult. Pt verbalizes understanding and agreement. Will continue to monitor.

## 2011-07-06 NOTE — ED Notes (Signed)
Vital signs stable. 

## 2011-07-06 NOTE — ED Notes (Signed)
Patient is resting comfortably. 

## 2011-07-06 NOTE — ED Provider Notes (Signed)
History     CSN: [id number] Arrival date & time: 07/06/2011  3:42 PM   First MD Initiated Contact with Patient 07/06/11 1553      Chief Complaint  Patient presents with  . Chest Pain    (Consider location/radiation/quality/duration/timing/severity/associated sxs/prior Treatment) HPI Patient relates she drives 3[place]2 hours to the mountains 3 days ago. She relates that evening she started having lower left chest pain that radiated into her back she states it was worse that first night. She states she thought maybe it was constant but was also coming off and going on. She denies any relationship to food or exercise she states nothing she did made it feel better or worse. She states yesterday she felt better but today driving home she had it off and on. She states it's mainly stinging in quality but sometimes a pressure. She denies pain or swelling in her legs. She has some mild shortness of breath but denies nausea or vomiting. She states she's never had it before. She was went to the office today saw Dr. Duane Lope at 2:15 and he sent her to the ER for evaluation.  Primary care physician Caryn Bee little  Past medical history anxiety  History reviewed. No pertinent past surgical history.  Family history father died at a young age from a myocardial infarction  History  Substance Use Topics  . Smoking status: Not on file  . Smokeless tobacco: Not on file  . Alcohol Use: No   patient is retired Patient lives with spouse  OB History    Grav Para Term Preterm Abortions TAB SAB Ect Mult Living                  Review of Systems  All other systems reviewed and are negative.    Allergies  Flexeril; Penicillins; and Skelaxin  Home Medications   Current Outpatient Rx  Name Route Sig Dispense Refill  . VITAMIN D 1000 UNITS PO TABS Oral Take 1,000 Units by mouth daily.      Marland Kitchen MELATONIN 1 MG PO CAPS Oral Take 1 mg by mouth daily.      Marland Kitchen ONE-DAILY MULTI VITAMINS PO TABS Oral Take 1  tablet by mouth daily.      . SERTRALINE HCL 50 MG PO TABS Oral Take 50 mg by mouth daily.      Marland Kitchen VITAMIN B-1 100 MG PO TABS Oral Take 100 mg by mouth daily.        BP 201/67  Pulse 69  Temp(Src) 98.4 F (36.9 C) (Oral)  Resp 16  SpO2 98%  Vital signs show hypertension otherwise normal  Physical Exam  Vitals reviewed. Constitutional: She is oriented to person, place, and time. She appears well-developed and well-nourished.  HENT:  Head: Normocephalic and atraumatic.  Mouth/Throat: Uvula is midline, oropharynx is clear and moist and mucous membranes are normal.  Eyes: Conjunctivae and EOM are normal. Pupils are equal, round, and reactive to light.  Neck: Normal range of motion. Neck supple.  Cardiovascular: Normal rate, regular rhythm and normal heart sounds.   Pulmonary/Chest: Effort normal and breath sounds normal.  Musculoskeletal: Normal range of motion.       No tenderness swelling or edema of her lower legs.  Neurological: She is alert and oriented to person, place, and time.  Skin: Skin is warm and dry.  Psychiatric: She has a normal mood and affect. Her behavior is normal. Thought content normal.    ED Course  Procedures (including critical care  time)  Results for orders placed during the hospital encounter of 07/06/11  CBC      Component Value Range   WBC 4.9  4.0 - 10.5 (K/uL)   RBC 4.25  3.87 - 5.11 (MIL/uL)   Hemoglobin 13.3  12.0 - 15.0 (g/dL)   HCT 40.9  81.1 - 91.4 (%)   MCV 93.9  78.0 - 100.0 (fL)   MCH 31.3  26.0 - 34.0 (pg)   MCHC 33.3  30.0 - 36.0 (g/dL)   RDW 78.2  95.6 - 21.3 (%)   Platelets 183  150 - 400 (K/uL)  DIFFERENTIAL      Component Value Range   Neutrophils Relative 59  43 - 77 (%)   Neutro Abs 2.9  1.7 - 7.7 (K/uL)   Lymphocytes Relative 27  12 - 46 (%)   Lymphs Abs 1.3  0.7 - 4.0 (K/uL)   Monocytes Relative 11  3 - 12 (%)   Monocytes Absolute 0.5  0.1 - 1.0 (K/uL)   Eosinophils Relative 2  0 - 5 (%)   Eosinophils Absolute 0.1  0.0  - 0.7 (K/uL)   Basophils Relative 0  0 - 1 (%)   Basophils Absolute 0.0  0.0 - 0.1 (K/uL)  COMPREHENSIVE METABOLIC PANEL      Component Value Range   Sodium 135  135 - 145 (mEq/L)   Potassium 3.6  3.5 - 5.1 (mEq/L)   Chloride 99  96 - 112 (mEq/L)   CO2 26  19 - 32 (mEq/L)   Glucose, Bld 95  70 - 99 (mg/dL)   BUN 20  6 - 23 (mg/dL)   Creatinine, Ser 0.86  0.50 - 1.10 (mg/dL)   Calcium 9.5  8.4 - 57.8 (mg/dL)   Total Protein 7.2  6.0 - 8.3 (g/dL)   Albumin 4.1  3.5 - 5.2 (g/dL)   AST 22  0 - 37 (U/L)   ALT 16  0 - 35 (U/L)   Alkaline Phosphatase 65  39 - 117 (U/L)   Total Bilirubin 0.3  0.3 - 1.2 (mg/dL)   GFR calc non Af Amer 86 (*) >90 (mL/min)   GFR calc Af Amer >90  >90 (mL/min)  APTT      Component Value Range   aPTT 30  24 - 37 (seconds)  PROTIME-INR      Component Value Range   Prothrombin Time 12.3  11.6 - 15.2 (seconds)   INR 0.90  0.00 - 1.49   D-DIMER, QUANTITATIVE      Component Value Range   D-Dimer, Quant 0.35  0.00 - 0.48 (ug/mL-FEU)  POCT I-STAT TROPONIN I      Component Value Range   Troponin i, poc 0.00  0.00 - 0.08 (ng/mL)   Comment 3             Laboratory interpretation all normal   Dg Chest Portable 1 View  07/06/2011  *RADIOLOGY REPORT*  Clinical Data: Chest pain  PORTABLE CHEST - 1 VIEW  Comparison: Chest radiograph 06/25/2005  Findings: Normal mediastinum and cardiac silhouette.  Normal pulmonary  vasculature.  No evidence of effusion, infiltrate, or pneumothorax.  No acute bony abnormality.  IMPRESSION: No acute cardiopulmonary process.  Original Report Authenticated By: Genevive Bi, M.D.    Date: 07/06/2011  Rate: 66  Rhythm: normal sinus rhythm  QRS Axis: normal  Intervals: normal  ST/T Wave abnormalities: normal  Conduction Disutrbances:none  Narrative Interpretation: NSTWC    Old EKG Reviewed: unchanged from Sep 12, 2004    Course Pt given ASA  Patient remained pain-free during her ER visit. 17:55 Dr Donnie Aho, have medicine  admit 19:10  Dr Perrin Maltese will admit  Diagnoses that have been ruled out:  Diagnoses that are still under consideration:  Final diagnoses:  Chest pain    Plan admit   Devoria Albe, MD, FACEP  MDM          Ward Givens, MD 07/06/11 1931

## 2011-07-06 NOTE — ED Notes (Signed)
Pt reports tight pressure like chest pain on Tuesday night 8/10, lasted approx 6 hours. No associated nausea, shob, diaphoresis. Sts has been happening intermittently since, 3/10 pain at these times. Saw PCP today and sent to ED due to possible EKG changes. Received 324mg  ASA by ems en route. No chest pain, shob, other complaints currently.

## 2011-07-07 LAB — LIPID PANEL
Cholesterol: 304 mg/dL — ABNORMAL HIGH (ref 0–200)
HDL: 99 mg/dL (ref >39)
LDL Cholesterol: 172 mg/dL — ABNORMAL HIGH (ref 0–99)
Triglycerides: 165 mg/dL — ABNORMAL HIGH (ref <150)
VLDL: 33 mg/dL (ref 0–40)

## 2011-07-07 LAB — CARDIAC PANEL(CRET KIN+CKTOT+MB+TROPI)
CK, MB: 2.7 ng/mL (ref 0.3–4.0)
Relative Index: INVALID (ref 0.0–2.5)
Total CK: 72 U/L (ref 7–177)

## 2011-07-07 LAB — BASIC METABOLIC PANEL
BUN: 17 mg/dL (ref 6–23)
Calcium: 9.8 mg/dL (ref 8.4–10.5)
Creatinine, Ser: 0.87 mg/dL (ref 0.50–1.10)
GFR calc Af Amer: 79 mL/min — ABNORMAL LOW (ref >90)
GFR calc non Af Amer: 68 mL/min — ABNORMAL LOW (ref >90)

## 2011-07-07 LAB — CBC
HCT: 39.5 % (ref 36.0–46.0)
MCHC: 33.7 g/dL (ref 30.0–36.0)
Platelets: 181 10*3/uL (ref 150–400)
RDW: 12.8 % (ref 11.5–15.5)

## 2011-07-07 MED ORDER — ZOLPIDEM TARTRATE 5 MG PO TABS
5.0000 mg | ORAL_TABLET | Freq: Every evening | ORAL | Status: DC | PRN
  Administered 2011-07-07 (×2): 5 mg via ORAL
  Filled 2011-07-07 (×2): qty 1

## 2011-07-07 MED ORDER — SERTRALINE HCL 50 MG PO TABS
50.0000 mg | ORAL_TABLET | Freq: Every day | ORAL | Status: DC
  Administered 2011-07-07 – 2011-07-08 (×2): 50 mg via ORAL
  Filled 2011-07-07 (×2): qty 1

## 2011-07-07 MED ORDER — ENOXAPARIN SODIUM 40 MG/0.4ML ~~LOC~~ SOLN
40.0000 mg | SUBCUTANEOUS | Status: DC
  Administered 2011-07-07 – 2011-07-08 (×2): 40 mg via SUBCUTANEOUS
  Filled 2011-07-07 (×2): qty 0.4

## 2011-07-07 MED ORDER — VITAMIN B-1 100 MG PO TABS
100.0000 mg | ORAL_TABLET | Freq: Every day | ORAL | Status: DC
  Administered 2011-07-07 – 2011-07-08 (×2): 100 mg via ORAL
  Filled 2011-07-07 (×2): qty 1

## 2011-07-07 NOTE — Progress Notes (Signed)
Subjective: No current chest pain  Objective: Vital signs in last 24 hours: Filed Vitals:   07/06/11 2347 07/06/11 2350 07/07/11 0025 07/07/11 0603  BP: 147/103  169/78 150/76  Pulse: 55  54 54  Temp:  98.5 F (36.9 C) 98.7 F (37.1 C) 98.3 F (36.8 C)  TempSrc:  Oral Oral   Resp: 15  20 20   Height:   5\' 2"  (1.575 m)   Weight:   52.39 kg (115 lb 8 oz)   SpO2: 98%  97% 95%    Intake/Output Summary (Last 24 hours) at 07/07/11 0936 Last data filed at 07/07/11 0500  Gross per 24 hour  Intake      0 ml  Output    550 ml  Net   -550 ml    Weight change:   General: Alert, awake, oriented x3, in no acute distress. Heart: Regular rate and rhythm, without murmurs, rubs, gallops. Lungs: Clear to auscultation bilaterally. Abdomen: Soft, nontender, nondistended, positive bowel sounds. Extremities: No clubbing cyanosis or edema with positive pedal pulses. Neuro: Grossly intact, nonfocal.   Lab Results:  [facility] 07/07/11 0755 07/06/11 1630  NA 138 135  K 4.1 3.6  CL 102 99  CO2 27 26  GLUCOSE 92 95  BUN 17 20  CREATININE 0.87 0.79  CALCIUM 9.8 9.5  MG -- --  PHOS -- --    Basename 07/06/11 1630  AST 22  ALT 16  ALKPHOS 65  BILITOT 0.3  PROT 7.2  ALBUMIN 4.1   No results found for this basename: LIPASE:2,AMYLASE:2 in the last 72 hours  Basename 07/07/11 0755 07/06/11 1630  WBC 5.0 4.9  NEUTROABS -- 2.9  HGB 13.3 13.3  HCT 39.5 39.9  MCV 93.8 93.9  PLT 181 183    Basename 07/07/11 0755 07/07/11 0049  CKTOTAL -- --  CKMB -- --  CKMBINDEX -- --  TROPONINI <0.30 <0.30   No results found for this basename: POCBNP:3 in the last 72 hours  Basename 07/06/11 1630  DDIMER 0.35   No results found for this basename: HGBA1C:2 in the last 72 hours No results found for this basename: CHOL:2,HDL:2,LDLCALC:2,TRIG:2,CHOLHDL:2,LDLDIRECT:2 in the last 72 hours No results found for this basename: TSH,T4TOTAL,FREET3,T3FREE,THYROIDAB in the last 72 hours No results  found for this basename: VITAMINB12:2,FOLATE:2,FERRITIN:2,TIBC:2,IRON:2,RETICCTPCT:2 in the last 72 hours  Micro Results: No results found for this or any previous visit (from the past 240 hour(s)).  Studies/Results: Dg Chest Portable 1 View  07/06/2011  *RADIOLOGY REPORT*  Clinical Data: Chest pain  PORTABLE CHEST - 1 VIEW  Comparison: Chest radiograph 06/25/2005  Findings: Normal mediastinum and cardiac silhouette.  Normal pulmonary  vasculature.  No evidence of effusion, infiltrate, or pneumothorax.  No acute bony abnormality.  IMPRESSION: No acute cardiopulmonary process.  Original Report Authenticated By: Genevive Bi, M.D.    Medications:     . aspirin EC  325 mg Oral Daily  . enoxaparin  40 mg Subcutaneous Q24H  . metoprolol tartrate  25 mg Oral BID  . pantoprazole      . pantoprazole  40 mg Oral Q1200  . sertraline  50 mg Oral Daily  . thiamine  100 mg Oral Daily  . DISCONTD: aspirin  324 mg Oral Once  . DISCONTD: aspirin  324 mg Oral Once  . DISCONTD: enoxaparin  40 mg Subcutaneous Q24H    Assessment/Plan Principal Problem:  *HTN (hypertension) Active Problems:  Chest pain  1. chest pain- questionable etiology. Patient currently chest pain-free. Point-of-care troponin  was negative x1. D-dimer was within normal limits. CXR x-ray was unremarkable. Will cycle cardiac enzymes every 8 hours x3. Will check a 2-D echo. Fasting lipid panel is pending. Continue aspirin, and Lopressor for blood pressure. PPI. Follow. 2. Hypertension - Patient started on low-dose Lopressor. Will continue and titrate accordingly. 3. Prophylaxis- PPI for GI. Lovenox for DVT.    LOS: 1 day   [facility] 07/07/2011, 9:36 AM

## 2011-07-08 DIAGNOSIS — E785 Hyperlipidemia, unspecified: Secondary | ICD-10-CM | POA: Diagnosis present

## 2011-07-08 DIAGNOSIS — R072 Precordial pain: Secondary | ICD-10-CM

## 2011-07-08 LAB — BASIC METABOLIC PANEL
Calcium: 9.7 mg/dL (ref 8.4–10.5)
GFR calc non Af Amer: 63 mL/min — ABNORMAL LOW (ref >90)
Glucose, Bld: 106 mg/dL — ABNORMAL HIGH (ref 70–99)
Potassium: 4 mEq/L (ref 3.5–5.1)
Sodium: 137 mEq/L (ref 135–145)

## 2011-07-08 LAB — CBC
Hemoglobin: 13.6 g/dL (ref 12.0–15.0)
MCH: 30.9 pg (ref 26.0–34.0)
Platelets: 182 10*3/uL (ref 150–400)
RBC: 4.4 MIL/uL (ref 3.87–5.11)
WBC: 5.3 10*3/uL (ref 4.0–10.5)

## 2011-07-08 MED ORDER — SIMVASTATIN 20 MG PO TABS: 20.0000 mg | ORAL_TABLET | Freq: Every day | ORAL | Status: DC

## 2011-07-08 MED ORDER — NITROGLYCERIN 0.4 MG SL SUBL: 0.4000 mg | SUBLINGUAL_TABLET | SUBLINGUAL | Status: DC | PRN

## 2011-07-08 MED ORDER — METOPROLOL TARTRATE 25 MG PO TABS: 25.0000 mg | ORAL_TABLET | Freq: Two times a day (BID) | ORAL | Status: AC

## 2011-07-08 NOTE — Progress Notes (Signed)
  Echocardiogram 2D Echocardiogram has been performed.  Kimberly [name] 07/08/2011, 12:40 PM

## 2011-07-08 NOTE — Discharge Summary (Signed)
Discharge Summary  Kimberly [name] MR#: [id number]  DOB:04/23/1946  Date of Admission: 07/06/2011 Date of Discharge: 07/08/2011  Patient's PCP: Mickie Hillier, MD  Attending Physician:Kyliah Deanda  Consults: None   Discharge Diagnoses: Chest Pain HTN (hypertension) Hyperlipidemia  Present on Admission:  .Chest pain .HTN (hypertension)  Brief Admitting History and Physical HPI:  This is a 65 y/o female with no co-morbidities, who [place] She was asleep and awoke from sleep. Chest pain was centrally located, radiated to the back. Described as sharp, constant, at its worse 8/10. Pain last all night Tuesday, went away at [facility]. She had a bit of pain Wednesday and today. Returned home today saw her MD who sent her to the ER. She reports some palpitations. No SOB, no diaphoresis, no LE edema, no h/o CAD. No GERD, some anxiety. She had a stress test once approximately 6 years ago. It was normal. Here her BP was quite elevated, she does not have a h/o HTN.    Discharge Medications Current Discharge Medication List    START taking these medications   Details  metoprolol tartrate (LOPRESSOR) 25 MG tablet Take 1 tablet (25 mg total) by mouth 2 (two) times daily. Qty: 62 tablet, Refills: 0    nitroGLYCERIN (NITROSTAT) 0.4 MG SL tablet Place 1 tablet (0.4 mg total) under the tongue every 5 (five) minutes as needed for chest pain. Qty: 30 tablet, Refills: 0    simvastatin (ZOCOR) 20 MG tablet Take 1 tablet (20 mg total) by mouth at bedtime. Qty: 30 tablet, Refills: 0      CONTINUE these medications which have NOT CHANGED   Details  cholecalciferol (VITAMIN D) 1000 UNITS tablet Take 1,000 Units by mouth daily.      Melatonin 1 MG CAPS Take 1 mg by mouth daily.      Multiple Vitamin (MULTIVITAMIN) tablet Take 1 tablet by mouth daily.      sertraline (ZOLOFT) 50 MG tablet Take 50 mg by mouth daily.      thiamine (VITAMIN B-1)  100 MG tablet Take 100 mg by mouth daily.          Hospital Course: 1.Chest Pain- Patient presented to ED with substernal chest pain. Patient was admitted to telemetry and cardiac enzymes cycled. Cardiac enzymes were negative. 2 d echo obtained had nl EF of 65%, with no wall motion abnormalities. FLP obtained was consistent with hyperlipidemia with a LDL of 172, HDL of 99, total cholesterol of 304, triglycerides of 165.  Patient on admission was noted to have BP of 201/67, and patient placed on lopressor.Patient's chest pain improved during hospitalization and patient was chest pain free by day of discharge. Patient will be d/c home in improved and stable condition to f/u with PCP in 1 week.  Patient will be d/c on statinPatient will need to be referred for out patient stress test. 2.HTN (hypertension)- On admission patient noted to have BP 201/67. Patient started on lopressor on admission with good response such that by day of discharge her BP was 110/62. Follow up with PCP as out patient.  3. Hyperlipidemia - Lipid panel during hospitalization had total cholesterol of 304, tg= 165, HDL=99, LDL = 172. Patient will be d/c'd on statin with follow up with PCP.  Present on Admission:  .Chest pain .HTN (hypertension) .Hyperlipidemia   Day of Discharge BP 110/62  Pulse 53  Temp(Src) 98.7 F (37.1 C) (Oral)  Resp 18  Ht 5\' 2"  (1.575 m)  Wt 52.39 kg (115 lb 8 oz)  BMI 21.13 kg/m2  SpO2 97%  Results for orders placed during the hospital encounter of 07/06/11 (from the past 48 hour(s))  CBC     Status: Normal   Collection Time   07/06/11  4:30 PM      Component Value Range Comment   WBC 4.9  4.0 - 10.5 (K/uL)    RBC 4.25  3.87 - 5.11 (MIL/uL)    Hemoglobin 13.3  12.0 - 15.0 (g/dL)    HCT 62.1  30.8 - 65.7 (%)    MCV 93.9  78.0 - 100.0 (fL)    MCH 31.3  26.0 - 34.0 (pg)    MCHC 33.3  30.0 - 36.0 (g/dL)    RDW 84.6  96.2 - 95.2 (%)    Platelets 183  150 - 400 (K/uL)   DIFFERENTIAL      Status: Normal   Collection Time   07/06/11  4:30 PM      Component Value Range Comment   Neutrophils Relative 59  43 - 77 (%)    Neutro Abs 2.9  1.7 - 7.7 (K/uL)    Lymphocytes Relative 27  12 - 46 (%)    Lymphs Abs 1.3  0.7 - 4.0 (K/uL)    Monocytes Relative 11  3 - 12 (%)    Monocytes Absolute 0.5  0.1 - 1.0 (K/uL)    Eosinophils Relative 2  0 - 5 (%)    Eosinophils Absolute 0.1  0.0 - 0.7 (K/uL)    Basophils Relative 0  0 - 1 (%)    Basophils Absolute 0.0  0.0 - 0.1 (K/uL)   COMPREHENSIVE METABOLIC PANEL     Status: Abnormal   Collection Time   07/06/11  4:30 PM      Component Value Range Comment   Sodium 135  135 - 145 (mEq/L)    Potassium 3.6  3.5 - 5.1 (mEq/L)    Chloride 99  96 - 112 (mEq/L)    CO2 26  19 - 32 (mEq/L)    Glucose, Bld 95  70 - 99 (mg/dL)    BUN 20  6 - 23 (mg/dL)    Creatinine, Ser 8.41  0.50 - 1.10 (mg/dL)    Calcium 9.5  8.4 - 10.5 (mg/dL)    Total Protein 7.2  6.0 - 8.3 (g/dL)    Albumin 4.1  3.5 - 5.2 (g/dL)    AST 22  0 - 37 (U/L)    ALT 16  0 - 35 (U/L)    Alkaline Phosphatase 65  39 - 117 (U/L)    Total Bilirubin 0.3  0.3 - 1.2 (mg/dL)    GFR calc non Af Amer 86 (*) >90 (mL/min)    GFR calc Af Amer >90  >90 (mL/min)   APTT     Status: Normal   Collection Time   07/06/11  4:30 PM      Component Value Range Comment   aPTT 30  24 - 37 (seconds)   PROTIME-INR     Status: Normal   Collection Time   07/06/11  4:30 PM      Component Value Range Comment   Prothrombin Time 12.3  11.6 - 15.2 (seconds)    INR 0.90  0.00 - 1.49    D-DIMER, QUANTITATIVE     Status: Normal   Collection Time   07/06/11  4:30 PM      Component Value Range Comment   D-Dimer, Quant 0.35  0.00 - 0.48 (ug/mL-FEU)   POCT I-STAT TROPONIN I     Status: Normal   Collection Time   07/06/11  5:06 PM      Component Value Range Comment   Troponin i, poc 0.00  0.00 - 0.08 (ng/mL)    Comment 3            TROPONIN I     Status: Normal   Collection Time   07/07/11 12:49 AM       Component Value Range Comment   Troponin I <0.30  <0.30 (ng/mL)   BASIC METABOLIC PANEL     Status: Abnormal   Collection Time   07/07/11  7:55 AM      Component Value Range Comment   Sodium 138  135 - 145 (mEq/L)    Potassium 4.1  3.5 - 5.1 (mEq/L)    Chloride 102  96 - 112 (mEq/L)    CO2 27  19 - 32 (mEq/L)    Glucose, Bld 92  70 - 99 (mg/dL)    BUN 17  6 - 23 (mg/dL)    Creatinine, Ser 5.62  0.50 - 1.10 (mg/dL)    Calcium 9.8  8.4 - 10.5 (mg/dL)    GFR calc non Af Amer 68 (*) >90 (mL/min)    GFR calc Af Amer 79 (*) >90 (mL/min)   CBC     Status: Normal   Collection Time   07/07/11  7:55 AM      Component Value Range Comment   WBC 5.0  4.0 - 10.5 (K/uL)    RBC 4.21  3.87 - 5.11 (MIL/uL)    Hemoglobin 13.3  12.0 - 15.0 (g/dL)    HCT 13.0  86.5 - 78.4 (%)    MCV 93.8  78.0 - 100.0 (fL)    MCH 31.6  26.0 - 34.0 (pg)    MCHC 33.7  30.0 - 36.0 (g/dL)    RDW 69.6  29.5 - 28.4 (%)    Platelets 181  150 - 400 (K/uL)   LIPID PANEL     Status: Abnormal   Collection Time   07/07/11  7:55 AM      Component Value Range Comment   Cholesterol 304 (*) 0 - 200 (mg/dL)    Triglycerides 132 (*) <150 (mg/dL)    HDL 99  >44 (mg/dL)    Total CHOL/HDL Ratio 3.1      VLDL 33  0 - 40 (mg/dL)    LDL Cholesterol 010 (*) 0 - 99 (mg/dL)   TROPONIN I     Status: Normal   Collection Time   07/07/11  7:55 AM      Component Value Range Comment   Troponin I <0.30  <0.30 (ng/mL)   CARDIAC PANEL(CRET KIN+CKTOT+MB+TROPI)     Status: Normal   Collection Time   07/07/11 10:35 AM      Component Value Range Comment   Total CK 72  7 - 177 (U/L)    CK, MB 2.7  0.3 - 4.0 (ng/mL)    Troponin I <0.30  <0.30 (ng/mL)    Relative Index RELATIVE INDEX IS INVALID  0.0 - 2.5    BASIC METABOLIC PANEL     Status: Abnormal   Collection Time   07/08/11  5:00 AM      Component Value Range Comment   Sodium 137  135 - 145 (mEq/L)    Potassium 4.0  3.5 - 5.1 (mEq/L)    Chloride 102  96 - 112 (  mEq/L)    CO2 27  19 -  32 (mEq/L)    Glucose, Bld 106 (*) 70 - 99 (mg/dL)    BUN 22  6 - 23 (mg/dL)    Creatinine, Ser 0.98  0.50 - 1.10 (mg/dL)    Calcium 9.7  8.4 - 10.5 (mg/dL)    GFR calc non Af Amer 63 (*) >90 (mL/min)    GFR calc Af Amer 73 (*) >90 (mL/min)   CBC     Status: Normal   Collection Time   07/08/11  5:00 AM      Component Value Range Comment   WBC 5.3  4.0 - 10.5 (K/uL)    RBC 4.40  3.87 - 5.11 (MIL/uL)    Hemoglobin 13.6  12.0 - 15.0 (g/dL)    HCT 11.9  14.7 - 82.9 (%)    MCV 95.5  78.0 - 100.0 (fL)    MCH 30.9  26.0 - 34.0 (pg)    MCHC 32.4  30.0 - 36.0 (g/dL)    RDW 56.2  13.0 - 86.5 (%)    Platelets 182  150 - 400 (K/uL)     Dg Chest Portable 1 View  07/06/2011  *RADIOLOGY REPORT*  Clinical Data: Chest pain  PORTABLE CHEST - 1 VIEW  Comparison: Chest radiograph 06/25/2005  Findings: Normal mediastinum and cardiac silhouette.  Normal pulmonary  vasculature.  No evidence of effusion, infiltrate, or pneumothorax.  No acute bony abnormality.  IMPRESSION: No acute cardiopulmonary process.  Original Report Authenticated By: Genevive Bi, M.D.     Disposition: Home  Diet: low salt diet  Activity: Increase slowly   Follow-up Appts: Discharge Orders    Future Appointments: Provider: Department: Dept Phone: Center:   08/16/2011 9:40 AM [facility] [phone] [facility]     Future Orders Please Complete By Expires   Diet - low sodium heart healthy      Increase activity slowly      Discharge instructions      Comments:   Follow up with Mickie Hillier, MD, in 1 week       TESTS THAT NEED FOLLOW-UP  Patient needs refferal for outpatient stress test. Patient needs repeat lipid panel, and CMET in 6-8 weeks  Time spent on discharge, talking to the patient, and coordinating care: 50 mins.   SignedRamiro Harvest 07/08/2011, 3:59 PM

## 2011-07-10 NOTE — Progress Notes (Signed)
[id number]/Rhonda Davis, RN, BSN, CCM/CHART REVIEW FOR UR PERFORMED. 

## 2011-08-10 ENCOUNTER — Other Ambulatory Visit: Payer: Self-pay | Admitting: Internal Medicine

## 2011-08-16 ENCOUNTER — Ambulatory Visit
Admission: RE | Admit: 2011-08-16 | Discharge: 2011-08-16 | Disposition: A | Discharge status: Home / Self Care | Payer: Medicare Other | Source: Ambulatory Visit | Attending: Family Medicine | Admitting: Family Medicine

## 2011-08-16 DIAGNOSIS — Z1231 Encounter for screening mammogram for malignant neoplasm of breast: Secondary | ICD-10-CM

## 2011-08-28 HISTORY — PX: BACK SURGERY: SHX140

## 2011-10-25 ENCOUNTER — Other Ambulatory Visit: Payer: Self-pay | Admitting: Nephrology

## 2011-10-25 DIAGNOSIS — R809 Proteinuria, unspecified: Secondary | ICD-10-CM

## 2011-10-29 ENCOUNTER — Ambulatory Visit
Admission: RE | Admit: 2011-10-29 | Discharge: 2011-10-29 | Disposition: A | Discharge status: Home / Self Care | Payer: Medicare Other | Source: Ambulatory Visit | Attending: Nephrology | Admitting: Nephrology

## 2011-10-29 DIAGNOSIS — R809 Proteinuria, unspecified: Secondary | ICD-10-CM

## 2011-11-15 ENCOUNTER — Emergency Department (HOSPITAL_COMMUNITY): Payer: Medicare Other

## 2011-11-15 ENCOUNTER — Encounter (HOSPITAL_COMMUNITY): Payer: Self-pay

## 2011-11-15 ENCOUNTER — Emergency Department (HOSPITAL_COMMUNITY)
Admission: EM | Admit: 2011-11-15 | Discharge: 2011-11-15 | Disposition: A | Discharge status: Home / Self Care | Payer: Medicare Other | Attending: Emergency Medicine | Admitting: Emergency Medicine

## 2011-11-15 DIAGNOSIS — S40012A Contusion of left shoulder, initial encounter: Secondary | ICD-10-CM

## 2011-11-15 DIAGNOSIS — R51 Headache: Secondary | ICD-10-CM | POA: Insufficient documentation

## 2011-11-15 DIAGNOSIS — R079 Chest pain, unspecified: Secondary | ICD-10-CM | POA: Insufficient documentation

## 2011-11-15 DIAGNOSIS — S7002XA Contusion of left hip, initial encounter: Secondary | ICD-10-CM

## 2011-11-15 DIAGNOSIS — W108XXA Fall (on) (from) other stairs and steps, initial encounter: Secondary | ICD-10-CM | POA: Insufficient documentation

## 2011-11-15 DIAGNOSIS — M542 Cervicalgia: Secondary | ICD-10-CM | POA: Insufficient documentation

## 2011-11-15 DIAGNOSIS — S20219A Contusion of unspecified front wall of thorax, initial encounter: Secondary | ICD-10-CM

## 2011-11-15 DIAGNOSIS — S40019A Contusion of unspecified shoulder, initial encounter: Secondary | ICD-10-CM | POA: Insufficient documentation

## 2011-11-15 DIAGNOSIS — S7000XA Contusion of unspecified hip, initial encounter: Secondary | ICD-10-CM | POA: Insufficient documentation

## 2011-11-15 DIAGNOSIS — I10 Essential (primary) hypertension: Secondary | ICD-10-CM | POA: Insufficient documentation

## 2011-11-15 DIAGNOSIS — M25519 Pain in unspecified shoulder: Secondary | ICD-10-CM | POA: Insufficient documentation

## 2011-11-15 HISTORY — DX: Essential (primary) hypertension: I10

## 2011-11-15 NOTE — Discharge Instructions (Signed)

## 2011-11-15 NOTE — ED Notes (Signed)
Pt alert and oriented x4. Respirations even and unlabored, bilateral symmetrical rise and fall of chest. Skin warm and dry. In no acute distress. Denies needs.   

## 2011-11-15 NOTE — ED Provider Notes (Signed)
History     CSN: [id number]  Arrival date & time 11/15/11  [id number]   First MD Initiated Contact with Patient 11/15/11 [phone]      Chief Complaint  Patient presents with  . Fall  . Neck Pain    headache    (Consider location/radiation/quality/duration/timing/severity/associated sxs/prior treatment) The history is provided by the patient.   the patient reports that she fell down 14-16 steps 5 days ago.  And should no loss consciousness.  She's had bruising of her left hip and left lower leg since the event.  She is not on anticoagulants.  She reports headache which is now improving.  She reports pain in her left lateral chest and her left anterior shoulder.  She denies weakness of her upper lower extremities.  She's had no fevers or chills.  She's had no nausea or vomiting.  She presents to the ER today because of ongoing pain in her left shoulder that has not completely resolved.  She reports she is sore in multiple places.  She does herself today.  She is showering she ambulated in to the ER without difficulty.  She drove herself to the ER.  She reports taking Tylenol home with significant improvement in her discomfort.  She is reading the paper at time of my history  Past Medical History  Diagnosis Date  . Arthritis 07/06/11    right thumb  . Hypertension     Past Surgical History  Procedure Date  . Abdominal hysterectomy approx. 2008  . Breast lumpectomy appro. 1990's    left - benign    Family History  Problem Relation Age of Onset  . Coronary artery disease Father   . Cancer Brother     History  Substance Use Topics  . Smoking status: Former Smoker -- 15 years    Types: Cigarettes    Quit date: 09/05/1991  . Smokeless tobacco: Never Used  . Alcohol Use: Yes     2 cocktails per night    OB History    Grav Para Term Preterm Abortions TAB SAB Ect Mult Living                  Review of Systems  HENT: Positive for neck pain.   All other systems reviewed and are  negative.    Allergies  Flexeril; Penicillins; and Skelaxin  Home Medications   Current Outpatient Rx  Name Route Sig Dispense Refill  . VITAMIN D 1000 UNITS PO TABS Oral Take 1,000 Units by mouth daily.      Marland Kitchen MELATONIN 1 MG PO CAPS Oral Take 1 mg by mouth daily.      Marland Kitchen METOPROLOL TARTRATE 25 MG PO TABS Oral Take 1 tablet (25 mg total) by mouth 2 (two) times daily. 62 tablet 0  . ONE-DAILY MULTI VITAMINS PO TABS Oral Take 1 tablet by mouth daily.      Marland Kitchen NITROGLYCERIN 0.4 MG SL SUBL Sublingual Place 1 tablet (0.4 mg total) under the tongue every 5 (five) minutes as needed for chest pain. 30 tablet 0  . SERTRALINE HCL 50 MG PO TABS Oral Take 50 mg by mouth daily.      Marland Kitchen SIMVASTATIN 20 MG PO TABS Oral Take 1 tablet (20 mg total) by mouth at bedtime. 30 tablet 0  . VITAMIN B-1 100 MG PO TABS Oral Take 100 mg by mouth daily.        BP 152/71  Pulse 65  Temp(Src) 98.7 F (37.1 C) (Oral)  Resp 16  SpO2 98%  Physical Exam  Nursing note and vitals reviewed. Constitutional: She is oriented to person, place, and time. She appears well-developed and well-nourished. No distress.  HENT:  Head: Normocephalic and atraumatic.  Eyes: EOM are normal. Pupils are equal, round, and reactive to light.  Neck: Normal range of motion.       No cervical spine tenderness.  Mild left trapezius and paracervical tenderness without spasm  Cardiovascular: Normal rate, regular rhythm and normal heart sounds.   Pulmonary/Chest: Effort normal and breath sounds normal.       Mild tenderness of left lateral chest wall without crepitus  Abdominal: Soft. She exhibits no distension. There is no tenderness.  Musculoskeletal: Normal range of motion.       Full range of motion of bilateral knees hips elbows or wrists.  She does have bruising of her left lateral tibia and reported bruising of her left lateral hip.  Full-strength in bilateral upper and lower extremity major muscle groups.  Mild tenderness at left a.c.  joint without obvious deformity  Neurological: She is alert and oriented to person, place, and time.  Skin: Skin is warm and dry.  Psychiatric: She has a normal mood and affect. Judgment normal.    ED Course  Procedures (including critical care time)  Labs Reviewed - No data to display Dg Shoulder Left  11/15/2011  *RADIOLOGY REPORT*  Clinical Data: Lateral shoulder pain status post fall while sleep walking.  LEFT SHOULDER - 2+ VIEW  Comparison: None.  Findings: The mineralization and alignment are normal.  There is no evidence of acute fracture or dislocation about the shoulder.  The subacromial space is preserved.  There are mild acromioclavicular degenerative changes.  On the Y-view, there is questionable irregularity of the left fourth rib.  No definite abnormality is seen in this area on the other views and this could be artifactual. There is no evidence of pneumothorax.  IMPRESSION:  1.  No evidence of acute fracture or dislocation at the left shoulder. 2.  Cannot exclude nondisplaced fracture of the left fourth rib.  Original Report Authenticated By: Gerrianne Scale, M.D.     1. Contusion of left shoulder   2. Contusion of chest   3. Contusion of left hip       MDM  My suspicion for intracranial injury or any other significant injury is very low.  Her left shoulder films were obtained and a question of possible nondisplaced left fourth rib fracture.  She's been sent back for chest x-ray to better evaluate.  She does not request anything for pain        Lyanne Co, MD 11/15/11 [phone]

## 2011-11-15 NOTE — ED Notes (Signed)
Pt to xray

## 2011-11-15 NOTE — ED Notes (Signed)
md at bedside talking with pt

## 2011-11-15 NOTE — ED Notes (Signed)
Patient reports that she fell down 16 carpeted stairs 6 days ago and today is still having headache, neck pain, and left arm pain. Patient denies N/V. Patient  Does not know if she had LOC.

## 2011-11-28 ENCOUNTER — Ambulatory Visit
Admission: RE | Admit: 2011-11-28 | Discharge: 2011-11-28 | Disposition: A | Discharge status: Home / Self Care | Payer: Medicare Other | Source: Ambulatory Visit | Attending: Orthopaedic Surgery | Admitting: Orthopaedic Surgery

## 2011-11-28 ENCOUNTER — Other Ambulatory Visit: Payer: Self-pay | Admitting: Orthopaedic Surgery

## 2011-11-28 DIAGNOSIS — M545 Low back pain: Secondary | ICD-10-CM

## 2011-11-28 DIAGNOSIS — IMO0002 Reserved for concepts with insufficient information to code with codable children: Secondary | ICD-10-CM

## 2012-07-04 ENCOUNTER — Other Ambulatory Visit: Payer: Self-pay | Admitting: Family Medicine

## 2012-07-04 DIAGNOSIS — Z1231 Encounter for screening mammogram for malignant neoplasm of breast: Secondary | ICD-10-CM

## 2012-10-01 ENCOUNTER — Ambulatory Visit
Admission: RE | Admit: 2012-10-01 | Discharge: 2012-10-01 | Disposition: A | Discharge status: Home / Self Care | Payer: Medicare PPO | Source: Ambulatory Visit | Attending: Family Medicine | Admitting: Family Medicine

## 2012-10-01 DIAGNOSIS — Z1231 Encounter for screening mammogram for malignant neoplasm of breast: Secondary | ICD-10-CM

## 2012-10-02 ENCOUNTER — Other Ambulatory Visit: Payer: Self-pay | Admitting: Family Medicine

## 2012-10-02 DIAGNOSIS — R928 Other abnormal and inconclusive findings on diagnostic imaging of breast: Secondary | ICD-10-CM

## 2012-10-03 ENCOUNTER — Ambulatory Visit
Admission: RE | Admit: 2012-10-03 | Discharge: 2012-10-03 | Disposition: A | Discharge status: Home / Self Care | Payer: Medicare PPO | Source: Ambulatory Visit | Attending: Family Medicine | Admitting: Family Medicine

## 2012-10-03 DIAGNOSIS — R928 Other abnormal and inconclusive findings on diagnostic imaging of breast: Secondary | ICD-10-CM

## 2013-02-11 ENCOUNTER — Other Ambulatory Visit: Payer: Self-pay | Admitting: Dermatology

## 2013-10-01 ENCOUNTER — Other Ambulatory Visit: Payer: Self-pay

## 2013-10-01 DIAGNOSIS — Z1231 Encounter for screening mammogram for malignant neoplasm of breast: Secondary | ICD-10-CM

## 2013-11-02 ENCOUNTER — Ambulatory Visit: Payer: Medicare PPO

## 2013-11-30 ENCOUNTER — Ambulatory Visit
Admission: RE | Admit: 2013-11-30 | Discharge: 2013-11-30 | Disposition: A | Discharge status: Home / Self Care | Payer: Medicare PPO | Source: Ambulatory Visit

## 2013-11-30 DIAGNOSIS — Z1231 Encounter for screening mammogram for malignant neoplasm of breast: Secondary | ICD-10-CM

## 2013-12-25 HISTORY — PX: SHOULDER ARTHROSCOPY: SHX128

## 2014-07-27 ENCOUNTER — Encounter (HOSPITAL_BASED_OUTPATIENT_CLINIC_OR_DEPARTMENT_OTHER): Payer: Self-pay | Admitting: *Deleted

## 2014-07-27 NOTE — Progress Notes (Signed)
To come in for ekg-bmet[facility]

## 2014-07-28 ENCOUNTER — Encounter (HOSPITAL_BASED_OUTPATIENT_CLINIC_OR_DEPARTMENT_OTHER)
Admission: RE | Admit: 2014-07-28 | Discharge: 2014-07-28 | Disposition: A | Discharge status: Home / Self Care | Payer: Medicare PPO | Source: Ambulatory Visit | Attending: Orthopedic Surgery | Admitting: Orthopedic Surgery

## 2014-07-28 ENCOUNTER — Ambulatory Visit: Payer: Self-pay | Admitting: Physician Assistant

## 2014-07-28 DIAGNOSIS — M19011 Primary osteoarthritis, right shoulder: Secondary | ICD-10-CM | POA: Diagnosis not present

## 2014-07-28 DIAGNOSIS — I10 Essential (primary) hypertension: Secondary | ICD-10-CM | POA: Diagnosis not present

## 2014-07-28 DIAGNOSIS — R001 Bradycardia, unspecified: Secondary | ICD-10-CM | POA: Diagnosis not present

## 2014-07-28 DIAGNOSIS — M75101 Unspecified rotator cuff tear or rupture of right shoulder, not specified as traumatic: Secondary | ICD-10-CM | POA: Diagnosis present

## 2014-07-28 DIAGNOSIS — Z87891 Personal history of nicotine dependence: Secondary | ICD-10-CM | POA: Diagnosis not present

## 2014-07-28 DIAGNOSIS — Z880 Allergy status to penicillin: Secondary | ICD-10-CM | POA: Diagnosis not present

## 2014-07-28 DIAGNOSIS — Z888 Allergy status to other drugs, medicaments and biological substances status: Secondary | ICD-10-CM | POA: Diagnosis not present

## 2014-07-28 DIAGNOSIS — Z9181 History of falling: Secondary | ICD-10-CM | POA: Diagnosis not present

## 2014-07-28 LAB — BASIC METABOLIC PANEL
Anion gap: 12 (ref 5–15)
BUN: 20 mg/dL (ref 6–23)
CO2: 28 meq/L (ref 19–32)
CREATININE: 0.85 mg/dL (ref 0.50–1.10)
Calcium: 9.7 mg/dL (ref 8.4–10.5)
Chloride: 102 mEq/L (ref 96–112)
GFR calc Af Amer: 80 mL/min — ABNORMAL LOW (ref >90)
GFR calc non Af Amer: 69 mL/min — ABNORMAL LOW (ref >90)
GLUCOSE: 75 mg/dL (ref 70–99)
Potassium: 4.6 mEq/L (ref 3.7–5.3)
SODIUM: 142 meq/L (ref 137–147)

## 2014-07-28 NOTE — H&P (Signed)
Kimberly [name] is an 68 y.o. female.   Chief Complaint: right shoulder pain s/p RTC repair HPI: She had rotator cuff repair on 5/27 it was an uneventful rotator cuff tear. She did have glenohumeral arthritis as well but despite appropriate treatment with physical therapy she doesn't feel she's regaining strength and still has significant pain. This is a chronic subsequent trauma she did suffer a fall. Initial MRI did not show a tear by there was an 80-90% leading to me to repair it with anchors.  Repeat MRI equivocally positive for recurrent tear with high pain level.  Past Medical History  Diagnosis Date  . Arthritis 07/06/11    right thumb  . Hypertension     Past Surgical History  Procedure Laterality Date  . Abdominal hysterectomy  approx. 2008  . Breast lumpectomy  appro. 1990's    left - benign  . Colonoscopy    . Tonsillectomy    . Back surgery  2013    lumb fusion  . Knee arthroscopy      both scopes  . Shoulder arthroscopy  5/15    right    Family History  Problem Relation Age of Onset  . Coronary artery disease Father   . Cancer Brother    Social History:  reports that she quit smoking about 22 years ago. Her smoking use included Cigarettes. She smoked 0.00 packs per day for 15 years. She has never used smokeless tobacco. She reports that she drinks alcohol. She reports that she does not use illicit drugs.  Allergies:  Allergies  Allergen Reactions  . Flexeril [Cyclobenzaprine Hcl] Other (See Comments)    Violently ill  . Penicillins Other (See Comments)    unsure  . Skelaxin Nausea And Vomiting     (Not in a hospital admission)  Results for orders placed or performed during the hospital encounter of 07/30/14 (from the past 48 hour(s))  Basic metabolic panel     Status: Abnormal   Collection Time: 07/28/14  9:50 AM  Result Value Ref Range   Sodium 142 137 - 147 mEq/L   Potassium 4.6 3.7 - 5.3 mEq/L   Chloride 102 96 - 112 mEq/L   CO2 28 19 - 32  mEq/L   Glucose, Bld 75 70 - 99 mg/dL   BUN 20 6 - 23 mg/dL   Creatinine, Ser 0.85 0.50 - 1.10 mg/dL   Calcium 9.7 8.4 - 10.5 mg/dL   GFR calc non Af Amer 69 (L) >90 mL/min   GFR calc Af Amer 80 (L) >90 mL/min    Comment: (NOTE) The eGFR has been calculated using the CKD EPI equation. This calculation has not been validated in all clinical situations. eGFR's persistently <90 mL/min signify possible Chronic Kidney Disease.    Anion gap 12 5 - 15   No results found.  Review of Systems  Constitutional: Negative.   HENT: Negative.   Eyes: Negative.   Respiratory: Negative.   Cardiovascular: Negative.   Gastrointestinal: Negative.   Genitourinary: Negative.   Musculoskeletal: Positive for joint pain. Negative for falls.  Skin: Negative.   Neurological: Negative.   Endo/Heme/Allergies: Negative.   Psychiatric/Behavioral: Negative.     There were no vitals taken for this visit. Physical Exam  Constitutional: She is oriented to person, place, and time. She appears well-developed and well-nourished. No distress.  HENT:  Head: Normocephalic and atraumatic.  Nose: Nose normal.  Eyes: Conjunctivae and EOM are normal. Pupils are equal, round, and reactive to light.  Neck: Normal range of motion. Neck supple.  Cardiovascular: Normal rate and intact distal pulses.   Respiratory: Effort normal. No respiratory distress. She has no wheezes.  GI: Soft. She exhibits no distension. There is no tenderness.  Musculoskeletal:       Right shoulder: She exhibits decreased range of motion, tenderness, pain and decreased strength.  Lymphadenopathy:    She has no cervical adenopathy.  Neurological: She is alert and oriented to person, place, and time. No cranial nerve deficit.  Skin: Skin is warm and dry. No rash noted. No erythema.  Psychiatric: She has a normal mood and affect. Her behavior is normal.     Assessment/Plan Questionable cuff re-tear right shoulder.  There is some artifact but  she is having enough pain and weakness with the MRI being equivocally positive to justify second look with possible re-repair of the rotator cuff.  Possible biologic patch.  Risks and benefits discussed with the patient in detail.  Proceed on with scheduling as soon as practicable.    Shonika Kolasinski 07/28/2014, 4:50 PM    

## 2014-07-30 ENCOUNTER — Ambulatory Visit (HOSPITAL_BASED_OUTPATIENT_CLINIC_OR_DEPARTMENT_OTHER): Payer: Medicare PPO | Admitting: Anesthesiology

## 2014-07-30 ENCOUNTER — Encounter (HOSPITAL_BASED_OUTPATIENT_CLINIC_OR_DEPARTMENT_OTHER)
Admission: RE | Disposition: A | Discharge status: Home / Self Care | Payer: Self-pay | Source: Ambulatory Visit | Attending: Orthopedic Surgery

## 2014-07-30 ENCOUNTER — Encounter (HOSPITAL_BASED_OUTPATIENT_CLINIC_OR_DEPARTMENT_OTHER): Payer: Self-pay | Admitting: Anesthesiology

## 2014-07-30 ENCOUNTER — Ambulatory Visit (HOSPITAL_BASED_OUTPATIENT_CLINIC_OR_DEPARTMENT_OTHER)
Admission: RE | Admit: 2014-07-30 | Discharge: 2014-07-30 | Disposition: A | Discharge status: Home / Self Care | Payer: Medicare PPO | Source: Ambulatory Visit | Attending: Orthopedic Surgery | Admitting: Orthopedic Surgery

## 2014-07-30 DIAGNOSIS — M19011 Primary osteoarthritis, right shoulder: Secondary | ICD-10-CM | POA: Insufficient documentation

## 2014-07-30 DIAGNOSIS — I10 Essential (primary) hypertension: Secondary | ICD-10-CM | POA: Diagnosis not present

## 2014-07-30 DIAGNOSIS — Z9181 History of falling: Secondary | ICD-10-CM | POA: Insufficient documentation

## 2014-07-30 DIAGNOSIS — Z880 Allergy status to penicillin: Secondary | ICD-10-CM | POA: Insufficient documentation

## 2014-07-30 DIAGNOSIS — Z87891 Personal history of nicotine dependence: Secondary | ICD-10-CM | POA: Insufficient documentation

## 2014-07-30 DIAGNOSIS — R001 Bradycardia, unspecified: Secondary | ICD-10-CM | POA: Insufficient documentation

## 2014-07-30 DIAGNOSIS — M75101 Unspecified rotator cuff tear or rupture of right shoulder, not specified as traumatic: Secondary | ICD-10-CM | POA: Diagnosis not present

## 2014-07-30 DIAGNOSIS — Z888 Allergy status to other drugs, medicaments and biological substances status: Secondary | ICD-10-CM | POA: Insufficient documentation

## 2014-07-30 HISTORY — PX: SHOULDER ARTHROSCOPY WITH ROTATOR CUFF REPAIR AND SUBACROMIAL DECOMPRESSION: SHX5686

## 2014-07-30 LAB — POCT HEMOGLOBIN-HEMACUE: Hemoglobin: 15.9 g/dL — ABNORMAL HIGH (ref 12.0–15.0)

## 2014-07-30 SURGERY — SHOULDER ARTHROSCOPY WITH ROTATOR CUFF REPAIR AND SUBACROMIAL DECOMPRESSION
Anesthesia: General | Site: Shoulder | Laterality: Right

## 2014-07-30 MED ORDER — DEXAMETHASONE SODIUM PHOSPHATE 4 MG/ML IJ SOLN
INTRAMUSCULAR | Status: DC | PRN
  Administered 2014-07-30: 10 mg via INTRAVENOUS

## 2014-07-30 MED ORDER — CHLORHEXIDINE GLUCONATE 4 % EX LIQD: 60.0000 mL | Freq: Once | CUTANEOUS | Status: DC

## 2014-07-30 MED ORDER — OXYCODONE-ACETAMINOPHEN 5-325 MG PO TABS: 1.0000 | ORAL_TABLET | ORAL | Status: DC | PRN

## 2014-07-30 MED ORDER — FENTANYL CITRATE 0.05 MG/ML IJ SOLN
50.0000 ug | INTRAMUSCULAR | Status: DC | PRN
  Administered 2014-07-30: 100 ug via INTRAVENOUS

## 2014-07-30 MED ORDER — EPHEDRINE SULFATE 50 MG/ML IJ SOLN
INTRAMUSCULAR | Status: DC | PRN
  Administered 2014-07-30 (×2): 10 mg via INTRAVENOUS

## 2014-07-30 MED ORDER — FENTANYL CITRATE 0.05 MG/ML IJ SOLN
INTRAMUSCULAR | Status: AC
  Filled 2014-07-30: qty 6

## 2014-07-30 MED ORDER — OXYCODONE HCL 5 MG/5ML PO SOLN: 5.0000 mg | Freq: Once | ORAL | Status: DC | PRN

## 2014-07-30 MED ORDER — ONDANSETRON HCL 4 MG/2ML IJ SOLN: 4.0000 mg | Freq: Once | INTRAMUSCULAR | Status: DC | PRN

## 2014-07-30 MED ORDER — MIDAZOLAM HCL 2 MG/2ML IJ SOLN
1.0000 mg | INTRAMUSCULAR | Status: DC | PRN
Start: 2014-07-30 — End: 2014-07-30
  Administered 2014-07-30: 2 mg via INTRAVENOUS

## 2014-07-30 MED ORDER — CLINDAMYCIN PHOSPHATE 900 MG/50ML IV SOLN
INTRAVENOUS | Status: AC
  Filled 2014-07-30: qty 50

## 2014-07-30 MED ORDER — SUCCINYLCHOLINE CHLORIDE 20 MG/ML IJ SOLN
INTRAMUSCULAR | Status: DC | PRN
  Administered 2014-07-30: 50 mg via INTRAVENOUS

## 2014-07-30 MED ORDER — LACTATED RINGERS IV SOLN
INTRAVENOUS | Status: DC
Start: 2014-07-30 — End: 2014-07-30
  Administered 2014-07-30 (×2): via INTRAVENOUS

## 2014-07-30 MED ORDER — MIDAZOLAM HCL 5 MG/5ML IJ SOLN
INTRAMUSCULAR | Status: DC | PRN
  Administered 2014-07-30: 1 mg via INTRAVENOUS

## 2014-07-30 MED ORDER — PHENYLEPHRINE HCL 10 MG/ML IJ SOLN
INTRAMUSCULAR | Status: DC | PRN
  Administered 2014-07-30 (×3): 40 ug via INTRAVENOUS

## 2014-07-30 MED ORDER — SODIUM CHLORIDE 0.9 % IV SOLN: INTRAVENOUS | Status: DC

## 2014-07-30 MED ORDER — FENTANYL CITRATE 0.05 MG/ML IJ SOLN
INTRAMUSCULAR | Status: DC | PRN
  Administered 2014-07-30: 50 ug via INTRAVENOUS

## 2014-07-30 MED ORDER — BUPIVACAINE-EPINEPHRINE (PF) 0.5% -1:200000 IJ SOLN
INTRAMUSCULAR | Status: DC | PRN
  Administered 2014-07-30 (×2): 30 mL via PERINEURAL

## 2014-07-30 MED ORDER — HYDROMORPHONE HCL 1 MG/ML IJ SOLN: 0.2500 mg | INTRAMUSCULAR | Status: DC | PRN

## 2014-07-30 MED ORDER — MEPERIDINE HCL 25 MG/ML IJ SOLN: 6.2500 mg | INTRAMUSCULAR | Status: DC | PRN

## 2014-07-30 MED ORDER — PROPOFOL 10 MG/ML IV BOLUS
INTRAVENOUS | Status: DC | PRN
  Administered 2014-07-30: 150 mg via INTRAVENOUS

## 2014-07-30 MED ORDER — DIAZEPAM 2 MG PO TABS: 2.0000 mg | ORAL_TABLET | Freq: Four times a day (QID) | ORAL | Status: DC | PRN

## 2014-07-30 MED ORDER — OXYCODONE HCL 5 MG PO TABS: 5.0000 mg | ORAL_TABLET | Freq: Once | ORAL | Status: DC | PRN

## 2014-07-30 MED ORDER — SODIUM CHLORIDE 0.9 % IR SOLN
Status: DC | PRN
  Administered 2014-07-30: 6000 mL

## 2014-07-30 MED ORDER — MIDAZOLAM HCL 2 MG/2ML IJ SOLN
INTRAMUSCULAR | Status: AC
  Filled 2014-07-30: qty 2

## 2014-07-30 MED ORDER — LIDOCAINE HCL (CARDIAC) 20 MG/ML IV SOLN
INTRAVENOUS | Status: DC | PRN
  Administered 2014-07-30: 100 mg via INTRAVENOUS

## 2014-07-30 MED ORDER — CLINDAMYCIN PHOSPHATE 900 MG/50ML IV SOLN: 900.0000 mg | INTRAVENOUS | Status: DC

## 2014-07-30 MED ORDER — CLINDAMYCIN PHOSPHATE 900 MG/50ML IV SOLN
INTRAVENOUS | Status: DC | PRN
  Administered 2014-07-30: 900 mg via INTRAVENOUS

## 2014-07-30 MED ORDER — ONDANSETRON HCL 4 MG/2ML IJ SOLN
INTRAMUSCULAR | Status: DC | PRN
  Administered 2014-07-30: 4 mg via INTRAVENOUS

## 2014-07-30 MED ORDER — FENTANYL CITRATE 0.05 MG/ML IJ SOLN
INTRAMUSCULAR | Status: AC
  Filled 2014-07-30: qty 2

## 2014-07-30 SURGICAL SUPPLY — 80 items
BENZOIN TINCTURE PRP APPL 2/3 (GAUZE/BANDAGES/DRESSINGS) ×2 IMPLANT
BLADE 4.2CUDA (BLADE) ×2 IMPLANT
BLADE AVERAGE 25X9 (BLADE) IMPLANT
BLADE CUTTER GATOR 3.5 (BLADE) IMPLANT
BLADE SURG 10 STRL SS (BLADE) IMPLANT
BLADE SURG 15 STRL LF DISP TIS (BLADE) ×1 IMPLANT
BLADE SURG 15 STRL SS (BLADE) ×1
BLADE VORTEX 6.0 (BLADE) IMPLANT
BUR 3.5 LG SPHERICAL (BURR) IMPLANT
BUR EGG 3PK/BX (BURR) IMPLANT
BUR OVAL 4.0 (BURR) IMPLANT
BUR OVAL 6.0 (BURR) ×2 IMPLANT
BUR VERTEX HOODED 4.5 (BURR) IMPLANT
BURR 3.5 LG SPHERICAL (BURR)
CANISTER SUCT 3000ML (MISCELLANEOUS) IMPLANT
CANNULA SHOULDER 7CM (CANNULA) ×2 IMPLANT
CANNULA TWIST IN 8.25X7CM (CANNULA) IMPLANT
CLEANER CAUTERY TIP 5X5 PAD (MISCELLANEOUS) IMPLANT
CUTTER MENISCUS  4.2MM (BLADE)
CUTTER MENISCUS 4.2MM (BLADE) IMPLANT
DECANTER SPIKE VIAL GLASS SM (MISCELLANEOUS) IMPLANT
DRAPE STERI 35X30 U-POUCH (DRAPES) ×2 IMPLANT
DRAPE SURG 17X23 STRL (DRAPES) ×2 IMPLANT
DRAPE U-SHAPE 76X120 STRL (DRAPES) ×4 IMPLANT
DRSG EMULSION OIL 3X3 NADH (GAUZE/BANDAGES/DRESSINGS) IMPLANT
DRSG PAD ABDOMINAL 8X10 ST (GAUZE/BANDAGES/DRESSINGS) ×2 IMPLANT
DURAPREP 26ML APPLICATOR (WOUND CARE) ×2 IMPLANT
ELECT REM PT RETURN 9FT ADLT (ELECTROSURGICAL) ×2
ELECTRODE REM PT RTRN 9FT ADLT (ELECTROSURGICAL) ×1 IMPLANT
GAUZE SPONGE 4X4 12PLY STRL (GAUZE/BANDAGES/DRESSINGS) ×2 IMPLANT
GLOVE BIO SURGEON STRL SZ7.5 (GLOVE) ×2 IMPLANT
GLOVE BIOGEL M STRL SZ7.5 (GLOVE) ×2 IMPLANT
GLOVE BIOGEL PI IND STRL 8 (GLOVE) ×3 IMPLANT
GLOVE BIOGEL PI IND STRL 8.5 (GLOVE) IMPLANT
GLOVE BIOGEL PI INDICATOR 8 (GLOVE) ×3
GLOVE BIOGEL PI INDICATOR 8.5 (GLOVE)
GLOVE SURG ORTHO 8.0 STRL STRW (GLOVE) ×2 IMPLANT
GOWN STRL REUS W/ TWL LRG LVL3 (GOWN DISPOSABLE) IMPLANT
GOWN STRL REUS W/ TWL XL LVL3 (GOWN DISPOSABLE) ×2 IMPLANT
GOWN STRL REUS W/TWL LRG LVL3 (GOWN DISPOSABLE)
GOWN STRL REUS W/TWL XL LVL3 (GOWN DISPOSABLE) ×4 IMPLANT
MANIFOLD NEPTUNE II (INSTRUMENTS) ×2 IMPLANT
NEEDLE 1/2 CIR CATGUT .05X1.09 (NEEDLE) IMPLANT
NEEDLE SCORPION MULTI FIRE (NEEDLE) IMPLANT
NS IRRIG 1000ML POUR BTL (IV SOLUTION) IMPLANT
PACK ARTHROSCOPY DSU (CUSTOM PROCEDURE TRAY) ×2 IMPLANT
PACK BASIN DAY SURGERY FS (CUSTOM PROCEDURE TRAY) ×2 IMPLANT
PAD CLEANER CAUTERY TIP 5X5 (MISCELLANEOUS)
PAD ORTHO SHOULDER 7X19 LRG (SOFTGOODS) IMPLANT
PENCIL BUTTON HOLSTER BLD 10FT (ELECTRODE) ×2 IMPLANT
SET ARTHROSCOPY TUBING (MISCELLANEOUS) ×1
SET ARTHROSCOPY TUBING LN (MISCELLANEOUS) ×1 IMPLANT
SLING ARM LRG ADULT FOAM STRAP (SOFTGOODS) IMPLANT
SLING ARM MED ADULT FOAM STRAP (SOFTGOODS) IMPLANT
SLING ULTRA II MEDIUM (SOFTGOODS) IMPLANT
SLING ULTRA II SMALL (SOFTGOODS) IMPLANT
SPONGE LAP 4X18 X RAY DECT (DISPOSABLE) ×2 IMPLANT
STAPLER VISISTAT 35W (STAPLE) IMPLANT
STRIP CLOSURE SKIN 1/2X4 (GAUZE/BANDAGES/DRESSINGS) ×2 IMPLANT
SUCTION FRAZIER TIP 10 FR DISP (SUCTIONS) IMPLANT
SUT BONE WAX W31G (SUTURE) IMPLANT
SUT ETHIBOND 2 OS 4 DA (SUTURE) IMPLANT
SUT ETHILON 4 0 PS 2 18 (SUTURE) IMPLANT
SUT FIBERWIRE #2 38 T-5 BLUE (SUTURE) ×2
SUT MNCRL AB 3-0 PS2 18 (SUTURE) ×2 IMPLANT
SUT PROLENE 3 0 PS 2 (SUTURE) IMPLANT
SUT TICRON 1 T 12 (SUTURE) ×2 IMPLANT
SUT TIGER TAPE 7 IN WHITE (SUTURE) IMPLANT
SUT VIC AB 0 CT1 27 (SUTURE)
SUT VIC AB 0 CT1 27XBRD ANBCTR (SUTURE) IMPLANT
SUT VIC AB 1 CT1 27 (SUTURE)
SUT VIC AB 1 CT1 27XBRD ANBCTR (SUTURE) IMPLANT
SUT VIC AB 2-0 PS2 27 (SUTURE) IMPLANT
SUT VIC AB 2-0 SH 27 (SUTURE) ×1
SUT VIC AB 2-0 SH 27XBRD (SUTURE) ×1 IMPLANT
SUTURE FIBERWR #2 38 T-5 BLUE (SUTURE) ×1 IMPLANT
TOWEL OR 17X24 6PK STRL BLUE (TOWEL DISPOSABLE) ×2 IMPLANT
WAND STAR VAC 90 (SURGICAL WAND) ×2 IMPLANT
WATER STERILE IRR 1000ML POUR (IV SOLUTION) ×2 IMPLANT
YANKAUER SUCT BULB TIP NO VENT (SUCTIONS) ×2 IMPLANT

## 2014-07-30 NOTE — Progress Notes (Signed)
Assisted Dr. Ossey with right, ultrasound guided, interscalene  block. Side rails up, monitors on throughout procedure. See vital signs in flow sheet. Tolerated Procedure well. 

## 2014-07-30 NOTE — Interval H&P Note (Signed)
History and Physical Interval Note:  07/30/2014 7:27 AM  Kimberly [name]  has presented today for surgery, with the diagnosis of other articular cartliage disorders, right shoulder, strain of muscle and tendon of rotator cuff right shoulder   The various methods of treatment have been discussed with the patient and family. After consideration of risks, benefits and other options for treatment, the patient has consented to  Procedure(s): RIGHT SHOULDER ARTHROSCOPY WITH DEBRIDEMENT AND POSSIBLE ROTATOR CUFF REPAIR     (Right) as a surgical intervention .  The patient's history has been reviewed, patient examined, no change in status, stable for surgery.  I have reviewed the patient's chart and labs.  Questions were answered to the patient's satisfaction.     Mihaela Fajardo JR,W D   

## 2014-07-30 NOTE — Anesthesia Preprocedure Evaluation (Signed)
Anesthesia Evaluation  Patient identified by MRN, date of birth, ID band Patient awake    Reviewed: Allergy & Precautions, H&P , NPO status , Patient's Chart, lab work & pertinent test results  Airway Mallampati: I TM Distance: >3 FB Neck ROM: Full    Dental   Pulmonary former smoker,          Cardiovascular hypertension, Pt. on medications     Neuro/Psych    GI/Hepatic   Endo/Other    Renal/GU      Musculoskeletal   Abdominal   Peds  Hematology   Anesthesia Other Findings   Reproductive/Obstetrics                           Anesthesia Physical Anesthesia Plan  ASA: II  Anesthesia Plan: General   Post-op Pain Management:    Induction: Intravenous  Airway Management Planned: Oral ETT  Additional Equipment:   Intra-op Plan:   Post-operative Plan: Extubation in OR  Informed Consent: I have reviewed the patients History and Physical, chart, labs and discussed the procedure including the risks, benefits and alternatives for the proposed anesthesia with the patient or authorized representative who has indicated his/her understanding and acceptance.     Plan Discussed with: CRNA and Surgeon  Anesthesia Plan Comments:         Anesthesia Quick Evaluation  

## 2014-07-30 NOTE — Brief Op Note (Signed)
07/30/2014  1:43 PM  PATIENT:  Kimberly [name]  68 y.o. female  PRE-OPERATIVE DIAGNOSIS:  other articular cartliage disorders, right shoulder, strain of muscle and tendon of rotator cuff right shoulder   POST-OPERATIVE DIAGNOSIS:  other articular cartliage disorders, right shoulder, strain of muscle and tendon of rotator cuff right shoulder   PROCEDURE:  Procedure(s): RIGHT SHOULDER ARTHROSCOPY WITH REVISION ACROMIOPLASTY, DEBRIDEMENT AND REVISION OPEN ROTATOR CUFF REPAIR    (Right)  SURGEON:  Surgeon(s) and Role:    * W D Carloyn Manneraffrey Jr., MD - Primary  PHYSICIAN ASSISTANT: Margart SicklesJoshua Ladarien Beeks, PA-C  ASSISTANTS:    ANESTHESIA:   regional and general  EBL:  Total I/O In: 1300 [I.V.:1300] Out: -   BLOOD ADMINISTERED:none  DRAINS: none   LOCAL MEDICATIONS USED:  NONE  SPECIMEN:  No Specimen  DISPOSITION OF SPECIMEN:  N/A  COUNTS:  YES  TOURNIQUET:  * No tourniquets in log *  DICTATION: .Other Dictation: Dictation Number unknown  PLAN OF CARE: Discharge to home after PACU  PATIENT DISPOSITION:  PACU - hemodynamically stable.   Delay start of Pharmacological VTE agent (>24hrs) due to surgical blood loss or risk of bleeding: not applicable

## 2014-07-30 NOTE — Discharge Instructions (Signed)
Post Anesthesia Home Care Instructions  Activity: Get plenty of rest for the remainder of the day. A responsible adult should stay with you for 24 hours following the procedure.  For the next 24 hours, DO NOT: -Drive a car -Advertising copywriterperate machinery -Drink alcoholic beverages -Take any medication unless instructed by your physician -Make any legal decisions or sign important papers.  Meals: Start with liquid foods such as gelatin or soup. Progress to regular foods as tolerated. Avoid greasy, spicy, heavy foods. If nausea and/or vomiting occur, drink only clear liquids until the nausea and/or vomiting subsides. Call your physician if vomiting continues.  Special Instructions/Symptoms: Your throat may feel dry or sore from the anesthesia or the breathing tube placed in your throat during surgery. If this causes discomfort, gargle with warm salt water. The discomfort should disappear within 24 hours. Regional Anesthesia Blocks  1. Numbness or the inability to move the "blocked" extremity may last from 3-48 hours after placement. The length of time depends on the medication injected and your individual response to the medication. If the numbness is not going away after 48 hours, call your surgeon.  2. The extremity that is blocked will need to be protected until the numbness is gone and the  Strength has returned. Because you cannot feel it, you will need to take extra care to avoid injury. Because it may be weak, you may have difficulty moving it or using it. You may not know what position it is in without looking at it while the block is in effect.  3. For blocks in the legs and feet, returning to weight bearing and walking needs to be done carefully. You will need to wait until the numbness is entirely gone and the strength has returned. You should be able to move your leg and foot normally before you try and bear weight or walk. You will need someone to be with you when you first try to ensure you do  not fall and possibly risk injury.  4. Bruising and tenderness at the needle site are common side effects and will resolve in a few days.  5. Persistent numbness or new problems with movement should be communicated to the surgeon or the [facility] [phone])/ [facility] [phone]).  Diet: As you were doing prior to hospitalization   Activity: Increase activity slowly as tolerated  No lifting or driving for 6 weeks   Shower: May shower without a dressing on post op day #3, NO SOAKING in tub, leave steri-strips in place   Dressing: You may change your dressing on post op day #3.  Then change the dressing daily with sterile 4"x4"s gauze dressing    Weight Bearing/Sling:  Remain in sling with pillow except for shower, NO PENDULUM EXERCISES, keep arm close to body when out of sling .   To prevent constipation: you may use a stool softener such as -  Colace ( over the counter) 100 mg by mouth twice a day  Drink plenty of fluids ( prune juice may be helpful) and high fiber foods  Miralax ( over the counter) for constipation as needed.   Precautions: If you experience chest pain or shortness of breath - call 911 immediately For transfer to the hospital emergency department!!  If you develop a fever greater that 101 F, purulent drainage from wound, increased redness or drainage from wound, or calf pain -- Call the office   Follow- Up Appointment: Please call for an appointment to  be seen in 1 week  [place] - [phone]

## 2014-07-30 NOTE — Transfer of Care (Signed)
Immediate Anesthesia Transfer of Care Note  Patient: Kimberly [name]  Procedure(s) Performed: Procedure(s): RIGHT SHOULDER ARTHROSCOPY WITH REVISION ACROMIOPLASTY, DEBRIDEMENT AND REVISION OPEN ROTATOR CUFF REPAIR    (Right)  Patient Location: PACU  Anesthesia Type:GA combined with regional for post-op pain  Level of Consciousness: awake, alert , oriented and patient cooperative  Airway & Oxygen Therapy: Patient Spontanous Breathing and Patient connected to face mask oxygen  Post-op Assessment: Report given to PACU RN and Post -op Vital signs reviewed and stable  Post vital signs: Reviewed and stable  Complications: No apparent anesthesia complications

## 2014-07-30 NOTE — Anesthesia Postprocedure Evaluation (Signed)
Anesthesia Post Note  Patient: Kimberly [name]  Procedure(s) Performed: Procedure(s) (LRB): RIGHT SHOULDER ARTHROSCOPY WITH REVISION ACROMIOPLASTY, DEBRIDEMENT AND REVISION OPEN ROTATOR CUFF REPAIR    (Right)  Anesthesia type: general  Patient location: PACU  Post pain: Pain level controlled  Post assessment: Patient's Cardiovascular Status Stable  Last Vitals:  Filed Vitals:   07/30/14 1500  BP: 143/71  Pulse: 77  Temp: 36.7 C  Resp: 18    Post vital signs: Reviewed and stable  Level of consciousness: sedated  Complications: No apparent anesthesia complications

## 2014-07-30 NOTE — H&P (View-Only) (Signed)
Kimberly [name] is an 68 y.o. female.   Chief Complaint: right shoulder pain s/p RTC repair HPI: She had rotator cuff repair on 5/27 it was an uneventful rotator cuff tear. She did have glenohumeral arthritis as well but despite appropriate treatment with physical therapy she doesn't feel she's regaining strength and still has significant pain. This is a chronic subsequent trauma she did suffer a fall. Initial MRI did not show a tear by there was an 80-90% leading to me to repair it with anchors.  Repeat MRI equivocally positive for recurrent tear with high pain level.  Past Medical History  Diagnosis Date  . Arthritis 07/06/11    right thumb  . Hypertension     Past Surgical History  Procedure Laterality Date  . Abdominal hysterectomy  approx. 2008  . Breast lumpectomy  appro. 1990's    left - benign  . Colonoscopy    . Tonsillectomy    . Back surgery  2013    lumb fusion  . Knee arthroscopy      both scopes  . Shoulder arthroscopy  5/15    right    Family History  Problem Relation Age of Onset  . Coronary artery disease Father   . Cancer Brother    Social History:  reports that she quit smoking about 22 years ago. Her smoking use included Cigarettes. She smoked 0.00 packs per day for 15 years. She has never used smokeless tobacco. She reports that she drinks alcohol. She reports that she does not use illicit drugs.  Allergies:  Allergies  Allergen Reactions  . Flexeril [Cyclobenzaprine Hcl] Other (See Comments)    Violently ill  . Penicillins Other (See Comments)    unsure  . Skelaxin Nausea And Vomiting     (Not in a hospital admission)  Results for orders placed or performed during the hospital encounter of 07/30/14 (from the past 48 hour(s))  Basic metabolic panel     Status: Abnormal   Collection Time: 07/28/14  9:50 AM  Result Value Ref Range   Sodium 142 137 - 147 mEq/L   Potassium 4.6 3.7 - 5.3 mEq/L   Chloride 102 96 - 112 mEq/L   CO2 28 19 - 32  mEq/L   Glucose, Bld 75 70 - 99 mg/dL   BUN 20 6 - 23 mg/dL   Creatinine, Ser 0.85 0.50 - 1.10 mg/dL   Calcium 9.7 8.4 - 10.5 mg/dL   GFR calc non Af Amer 69 (L) >90 mL/min   GFR calc Af Amer 80 (L) >90 mL/min    Comment: (NOTE) The eGFR has been calculated using the CKD EPI equation. This calculation has not been validated in all clinical situations. eGFR's persistently <90 mL/min signify possible Chronic Kidney Disease.    Anion gap 12 5 - 15   No results found.  Review of Systems  Constitutional: Negative.   HENT: Negative.   Eyes: Negative.   Respiratory: Negative.   Cardiovascular: Negative.   Gastrointestinal: Negative.   Genitourinary: Negative.   Musculoskeletal: Positive for joint pain. Negative for falls.  Skin: Negative.   Neurological: Negative.   Endo/Heme/Allergies: Negative.   Psychiatric/Behavioral: Negative.     There were no vitals taken for this visit. Physical Exam  Constitutional: She is oriented to person, place, and time. She appears well-developed and well-nourished. No distress.  HENT:  Head: Normocephalic and atraumatic.  Nose: Nose normal.  Eyes: Conjunctivae and EOM are normal. Pupils are equal, round, and reactive to light.  Neck: Normal range of motion. Neck supple.  Cardiovascular: Normal rate and intact distal pulses.   Respiratory: Effort normal. No respiratory distress. She has no wheezes.  GI: Soft. She exhibits no distension. There is no tenderness.  Musculoskeletal:       Right shoulder: She exhibits decreased range of motion, tenderness, pain and decreased strength.  Lymphadenopathy:    She has no cervical adenopathy.  Neurological: She is alert and oriented to person, place, and time. No cranial nerve deficit.  Skin: Skin is warm and dry. No rash noted. No erythema.  Psychiatric: She has a normal mood and affect. Her behavior is normal.     Assessment/Plan Questionable cuff re-tear right shoulder.  There is some artifact but  she is having enough pain and weakness with the MRI being equivocally positive to justify second look with possible re-repair of the rotator cuff.  Possible biologic patch.  Risks and benefits discussed with the patient in detail.  Proceed on with scheduling as soon as practicable.    Chriss Czar 07/28/2014, 4:50 PM

## 2014-07-30 NOTE — Interval H&P Note (Signed)
History and Physical Interval Note:  07/30/2014 7:27 AM  Kimberly [name]  has presented today for surgery, with the diagnosis of other articular cartliage disorders, right shoulder, strain of muscle and tendon of rotator cuff right shoulder   The various methods of treatment have been discussed with the patient and family. After consideration of risks, benefits and other options for treatment, the patient has consented to  Procedure(s): RIGHT SHOULDER ARTHROSCOPY WITH DEBRIDEMENT AND POSSIBLE ROTATOR CUFF REPAIR     (Right) as a surgical intervention .  The patient's history has been reviewed, patient examined, no change in status, stable for surgery.  I have reviewed the patient's chart and labs.  Questions were answered to the patient's satisfaction.     Dilynn Munroe JR,W D

## 2014-07-30 NOTE — Anesthesia Procedure Notes (Addendum)
Anesthesia Regional Block:  Interscalene brachial plexus block  Pre-Anesthetic Checklist: ,, timeout performed, Correct Patient, Correct Site, Correct Laterality, Correct Procedure, Correct Position, site marked, Risks and benefits discussed,  Surgical consent,  Pre-op evaluation,  At surgeon's request and post-op pain management  Laterality: Right  Prep: chloraprep       Needles:  Injection technique: Single-shot  Needle Type: Echogenic Stimulator Needle     Needle Length: 9cm 9 cm Needle Gauge: 21 and 21 G    Additional Needles:  Procedures: ultrasound guided (picture in chart) and nerve stimulator Interscalene brachial plexus block  Nerve Stimulator or Paresthesia:  Response: 0.4 mA,   Additional Responses:   Narrative:  Start time: 07/30/2014 11:00 AM End time: 07/30/2014 11:10 AM Injection made incrementally with aspirations every 5 mL.  Performed by: Personally  Anesthesiologist: Arta BruceSSEY, KEVIN  Additional Notes: Monitors applied. Patient sedated. Sterile prep and drape,hand hygiene and sterile gloves were used. Relevant anatomy identified.Needle position confirmed.Local anesthetic injected incrementally after negative aspiration. Local anesthetic spread visualized around nerve(s). Vascular puncture avoided. No complications. Image printed for medical record.The patient tolerated the procedure well.        Procedure Name: Intubation Date/Time: 07/30/2014 11:58 AM Performed by:  DesanctisLINKA, Marshayla Mitschke L Pre-anesthesia Checklist: Patient identified, Emergency Drugs available, Suction available, Patient being monitored and Timeout performed Patient Re-evaluated:Patient Re-evaluated prior to inductionOxygen Delivery Method: Circle System Utilized Preoxygenation: Pre-oxygenation with 100% oxygen Intubation Type: IV induction Ventilation: Mask ventilation without difficulty Laryngoscope Size: Miller and 2 Grade View: Grade II Tube type: Oral Tube size: 7.0 mm Number of  attempts: 1 Airway Equipment and Method: stylet and oral airway Placement Confirmation: ETT inserted through vocal cords under direct vision,  positive ETCO2 and breath sounds checked- equal and bilateral Secured at: 20 cm Tube secured with: Tape Dental Injury: Teeth and Oropharynx as per pre-operative assessment

## 2014-08-02 ENCOUNTER — Encounter (HOSPITAL_BASED_OUTPATIENT_CLINIC_OR_DEPARTMENT_OTHER): Payer: Self-pay | Admitting: Orthopedic Surgery

## 2014-08-03 NOTE — Op Note (Deleted)
NAME:  Kimberly [name], Kimberly [name]          ACCOUNT NO.:  [id number]  MEDICAL RECORD NO.:  [id number]  LOCATION:                               FACILITY:  [facility]  PHYSICIAN:  W. D. Felesia Stahlecker, M.D.         DATE OF BIRTH:  DATE OF PROCEDURE:  07/30/2014 DATE OF DISCHARGE:  07/30/2014                              OPERATIVE REPORT   INDICATIONS:  Patient had had a cuff repair with __________ noted pain despite good compliance from the patient __________ on physical therapy. She continued to complain of weakness leading to a new MRI which showed a probable tear, thought to be amenable to surgery.  PREOPERATIVE DIAGNOSES: 1. Recurrent rotator cuff tear, right shoulder. 2. Degenerative tear, inferior labrum with early glenohumeral     arthritis.  POSTOPERATIVE DIAGNOSES: 1. Recurrent rotator cuff tear, right shoulder. 2. Degenerative tear, inferior labrum with early glenohumeral     arthritis.  OPERATION: 1. Arthroscopic debridement __________ extensive. 2. Open rotator cuff repair, all for the right shoulder.  SURGEON:  W. D. Yitzhak Awan, MD  ASSISTANT:  [place].  ANESTHESIA:  General with a nerve block.  DESCRIPTION OF PROCEDURE:  Examination under anesthesia,  she had a reasonably good range of motion, slightly diminished rotation.  She was arthroscoped in beach-chair position, posterior lateral and anterior portal.  Systematic inspection of the shoulder showed patient had, had some progression of her glenohumeral degenerative change that then passed early grade 3 changes were noted.  There was degenerative tear and the anterior inferior labrum was debrided.  Biceps tendon anchor intact as was subscapularis.  __________ longitudinal split at the supraspinatus tendon, it seemed like it was somewhat in front of the old transverse tear, in any event, I elected after debriding the bursa and performed a small revision acromioplasty to open the old incision revealing the longitudinal cuff tear.   Several of the other old sutures were removed.  We then repaired the longitudinal split with a running #2 FiberWire suture, essentially created a watertight repair.  The deltoid which had been re-split was closed with #1 Tycron and the subcu tissues with 2-0 Vicryl, Monocryl on the skin and lightly compressive sterile dressing __________ splint applied, taken to recovery room in stable condition.     W. D. Arelys Glassco, M.D.     WDC/MEDQ  D:  07/30/2014  T:  07/31/2014  Job:  [id number] 

## 2014-08-03 NOTE — Op Note (Signed)
NAMArmond [name]:  [name], Kimberly          ACCOUNT NO.:  [id number]  MEDICAL RECORD NO.:  [id number]  LOCATION:                               FACILITY:  [facility]  PHYSICIAN:  Dyke BrackettW. D. Kelyn Ponciano, M.D.         DATE OF BIRTH:  DATE OF PROCEDURE:  07/30/2014 DATE OF DISCHARGE:  07/30/2014                              OPERATIVE REPORT   INDICATIONS:  Patient had had a cuff repair with __________ noted pain despite good compliance from the patient __________ on physical therapy. She continued to complain of weakness leading to a new MRI which showed a probable tear, thought to be amenable to surgery.  PREOPERATIVE DIAGNOSES: 1. Recurrent rotator cuff tear, right shoulder. 2. Degenerative tear, inferior labrum with early glenohumeral     arthritis.  POSTOPERATIVE DIAGNOSES: 1. Recurrent rotator cuff tear, right shoulder. 2. Degenerative tear, inferior labrum with early glenohumeral     arthritis.  OPERATION: 1. Arthroscopic debridement __________ extensive. 2. Open rotator cuff repair, all for the right shoulder.  SURGEON:  Dyke BrackettW. D. Leldon Steege, MD  ASSISTANVincent Peyer:  [place].  ANESTHESIA:  General with a nerve block.  DESCRIPTION OF PROCEDURE:  Examination under anesthesia,  she had a reasonably good range of motion, slightly diminished rotation.  She was arthroscoped in beach-chair position, posterior lateral and anterior portal.  Systematic inspection of the shoulder showed patient had, had some progression of her glenohumeral degenerative change that then passed early grade 3 changes were noted.  There was degenerative tear and the anterior inferior labrum was debrided.  Biceps tendon anchor intact as was subscapularis.  __________ longitudinal split at the supraspinatus tendon, it seemed like it was somewhat in front of the old transverse tear, in any event, I elected after debriding the bursa and performed a small revision acromioplasty to open the old incision revealing the longitudinal cuff tear.   Several of the other old sutures were removed.  We then repaired the longitudinal split with a running #2 FiberWire suture, essentially created a watertight repair.  The deltoid which had been re-split was closed with #1 Tycron and the subcu tissues with 2-0 Vicryl, Monocryl on the skin and lightly compressive sterile dressing __________ splint applied, taken to recovery room in stable condition.     Dyke BrackettW. D. Kameryn Tisdel, M.D.     WDC/MEDQ  D:  07/30/2014  T:  07/31/2014  Job:  [id number]

## 2014-10-22 ENCOUNTER — Other Ambulatory Visit: Payer: Self-pay

## 2014-10-22 DIAGNOSIS — Z1231 Encounter for screening mammogram for malignant neoplasm of breast: Secondary | ICD-10-CM

## 2014-12-02 ENCOUNTER — Ambulatory Visit
Admission: RE | Admit: 2014-12-02 | Discharge: 2014-12-02 | Disposition: A | Discharge status: Home / Self Care | Payer: Medicare PPO | Source: Ambulatory Visit

## 2014-12-02 DIAGNOSIS — Z1231 Encounter for screening mammogram for malignant neoplasm of breast: Secondary | ICD-10-CM

## 2015-03-04 ENCOUNTER — Ambulatory Visit (INDEPENDENT_AMBULATORY_CARE_PROVIDER_SITE_OTHER): Payer: Medicare PPO | Admitting: Podiatry

## 2015-03-04 DIAGNOSIS — M129 Arthropathy, unspecified: Secondary | ICD-10-CM | POA: Diagnosis not present

## 2015-03-04 DIAGNOSIS — M19079 Primary osteoarthritis, unspecified ankle and foot: Secondary | ICD-10-CM

## 2015-03-04 DIAGNOSIS — G5792 Unspecified mononeuropathy of left lower limb: Secondary | ICD-10-CM | POA: Diagnosis not present

## 2015-03-04 NOTE — Progress Notes (Signed)
Subjective:     Patient ID: Kimberly [name], female   DOB: 06/09/1946, 69 y.o.   MRN: [id number]  HPIThis patient returns to my office saying she has developed pain on the top of her left foot.  She says she has sharp shooting pain walking and wearing her shoes.  She has no history of trauma.  She says the pain is worsening over time.  She is planning a trip and presents for evaluation of her left foot.   Review of Systems     Objective:   Physical Exam Objective: Review of past medical history, medications, social history and allergies were performed.  Vascular: Dorsalis pedis and posterior tibial pulses were palpable B/L, capillary refill was  WNL B/L, temperature gradient was WNL B/L   Skin:  No signs of symptoms of infection or ulcers on both feet  Nails: appear healthy with no signs of mycosis or infections  Sensory: Semmes Weinstein monifilament WNL   Orthopedic: Orthopedic evaluation demonstrates all joints distal t ankle have full ROM without crepitus, muscle power WNL B/L.  There is bony exostosis over the dorsum both feet.  She has asymptomatic HAV  There is sharp pain upon palpation of bony exostosis sending sharp pain through her feet.    Assessment:     Neuritis secondary to arthritis left foot.     Plan:     ROV.  X-ray.  Discussed condition with patient and decided to provide her Mobic.  Gave her explanation and she will try lacing her shoes differently.

## 2015-03-31 DIAGNOSIS — L299 Pruritus, unspecified: Secondary | ICD-10-CM | POA: Diagnosis not present

## 2015-03-31 DIAGNOSIS — H6063 Unspecified chronic otitis externa, bilateral: Secondary | ICD-10-CM | POA: Diagnosis not present

## 2015-04-21 DIAGNOSIS — Z1211 Encounter for screening for malignant neoplasm of colon: Secondary | ICD-10-CM | POA: Diagnosis not present

## 2015-04-21 DIAGNOSIS — Z0000 Encounter for general adult medical examination without abnormal findings: Secondary | ICD-10-CM | POA: Diagnosis not present

## 2015-04-21 DIAGNOSIS — F411 Generalized anxiety disorder: Secondary | ICD-10-CM | POA: Diagnosis not present

## 2015-04-21 DIAGNOSIS — H9210 Otorrhea, unspecified ear: Secondary | ICD-10-CM | POA: Diagnosis not present

## 2015-04-21 DIAGNOSIS — I10 Essential (primary) hypertension: Secondary | ICD-10-CM | POA: Diagnosis not present

## 2015-04-29 DIAGNOSIS — L309 Dermatitis, unspecified: Secondary | ICD-10-CM | POA: Diagnosis not present

## 2015-05-04 DIAGNOSIS — S76319A Strain of muscle, fascia and tendon of the posterior muscle group at thigh level, unspecified thigh, initial encounter: Secondary | ICD-10-CM | POA: Diagnosis not present

## 2015-05-13 DIAGNOSIS — L719 Rosacea, unspecified: Secondary | ICD-10-CM | POA: Diagnosis not present

## 2015-05-13 DIAGNOSIS — H04123 Dry eye syndrome of bilateral lacrimal glands: Secondary | ICD-10-CM | POA: Diagnosis not present

## 2015-06-28 DIAGNOSIS — M25552 Pain in left hip: Secondary | ICD-10-CM | POA: Diagnosis not present

## 2015-07-01 DIAGNOSIS — M5442 Lumbago with sciatica, left side: Secondary | ICD-10-CM | POA: Diagnosis not present

## 2015-07-01 DIAGNOSIS — M25552 Pain in left hip: Secondary | ICD-10-CM | POA: Diagnosis not present

## 2015-07-04 DIAGNOSIS — M5416 Radiculopathy, lumbar region: Secondary | ICD-10-CM | POA: Diagnosis not present

## 2015-07-04 DIAGNOSIS — M5440 Lumbago with sciatica, unspecified side: Secondary | ICD-10-CM | POA: Diagnosis not present

## 2015-07-07 DIAGNOSIS — M5416 Radiculopathy, lumbar region: Secondary | ICD-10-CM | POA: Diagnosis not present

## 2015-07-07 DIAGNOSIS — M5440 Lumbago with sciatica, unspecified side: Secondary | ICD-10-CM | POA: Diagnosis not present

## 2015-07-15 DIAGNOSIS — M5416 Radiculopathy, lumbar region: Secondary | ICD-10-CM | POA: Diagnosis not present

## 2015-07-15 DIAGNOSIS — M5440 Lumbago with sciatica, unspecified side: Secondary | ICD-10-CM | POA: Diagnosis not present

## 2015-07-29 DIAGNOSIS — M5416 Radiculopathy, lumbar region: Secondary | ICD-10-CM | POA: Diagnosis not present

## 2015-07-29 DIAGNOSIS — M5440 Lumbago with sciatica, unspecified side: Secondary | ICD-10-CM | POA: Diagnosis not present

## 2015-08-04 DIAGNOSIS — M5416 Radiculopathy, lumbar region: Secondary | ICD-10-CM | POA: Diagnosis not present

## 2015-08-04 DIAGNOSIS — M5440 Lumbago with sciatica, unspecified side: Secondary | ICD-10-CM | POA: Diagnosis not present

## 2015-08-12 DIAGNOSIS — M545 Low back pain: Secondary | ICD-10-CM | POA: Diagnosis not present

## 2015-08-18 DIAGNOSIS — M5416 Radiculopathy, lumbar region: Secondary | ICD-10-CM | POA: Diagnosis not present

## 2015-08-18 DIAGNOSIS — M4716 Other spondylosis with myelopathy, lumbar region: Secondary | ICD-10-CM | POA: Diagnosis not present

## 2015-08-18 DIAGNOSIS — M545 Low back pain: Secondary | ICD-10-CM | POA: Diagnosis not present

## 2015-08-18 DIAGNOSIS — M4807 Spinal stenosis, lumbosacral region: Secondary | ICD-10-CM | POA: Diagnosis not present

## 2015-08-18 DIAGNOSIS — M5106 Intervertebral disc disorders with myelopathy, lumbar region: Secondary | ICD-10-CM | POA: Diagnosis not present

## 2015-10-26 HISTORY — PX: BACK SURGERY: SHX140

## 2016-05-20 ENCOUNTER — Encounter (HOSPITAL_COMMUNITY): Payer: Self-pay | Admitting: Emergency Medicine

## 2016-05-20 ENCOUNTER — Emergency Department (HOSPITAL_COMMUNITY): Payer: Medicare Other

## 2016-05-20 ENCOUNTER — Observation Stay (HOSPITAL_COMMUNITY)
Admission: EM | Admit: 2016-05-20 | Discharge: 2016-05-21 | Disposition: A | Discharge status: Home / Self Care | Payer: Medicare Other | Attending: Internal Medicine | Admitting: Internal Medicine

## 2016-05-20 DIAGNOSIS — Z8249 Family history of ischemic heart disease and other diseases of the circulatory system: Secondary | ICD-10-CM | POA: Diagnosis not present

## 2016-05-20 DIAGNOSIS — R0602 Shortness of breath: Secondary | ICD-10-CM | POA: Diagnosis not present

## 2016-05-20 DIAGNOSIS — F329 Major depressive disorder, single episode, unspecified: Secondary | ICD-10-CM

## 2016-05-20 DIAGNOSIS — Z7982 Long term (current) use of aspirin: Secondary | ICD-10-CM | POA: Diagnosis not present

## 2016-05-20 DIAGNOSIS — Z90710 Acquired absence of both cervix and uterus: Secondary | ICD-10-CM | POA: Diagnosis not present

## 2016-05-20 DIAGNOSIS — M19041 Primary osteoarthritis, right hand: Secondary | ICD-10-CM | POA: Insufficient documentation

## 2016-05-20 DIAGNOSIS — Z880 Allergy status to penicillin: Secondary | ICD-10-CM | POA: Diagnosis not present

## 2016-05-20 DIAGNOSIS — M11261 Other chondrocalcinosis, right knee: Secondary | ICD-10-CM | POA: Diagnosis not present

## 2016-05-20 DIAGNOSIS — I10 Essential (primary) hypertension: Secondary | ICD-10-CM | POA: Insufficient documentation

## 2016-05-20 DIAGNOSIS — Z888 Allergy status to other drugs, medicaments and biological substances status: Secondary | ICD-10-CM | POA: Insufficient documentation

## 2016-05-20 DIAGNOSIS — Z87891 Personal history of nicotine dependence: Secondary | ICD-10-CM | POA: Insufficient documentation

## 2016-05-20 DIAGNOSIS — R1310 Dysphagia, unspecified: Secondary | ICD-10-CM | POA: Insufficient documentation

## 2016-05-20 DIAGNOSIS — R079 Chest pain, unspecified: Secondary | ICD-10-CM | POA: Diagnosis not present

## 2016-05-20 DIAGNOSIS — R110 Nausea: Secondary | ICD-10-CM | POA: Insufficient documentation

## 2016-05-20 DIAGNOSIS — E785 Hyperlipidemia, unspecified: Secondary | ICD-10-CM | POA: Diagnosis not present

## 2016-05-20 DIAGNOSIS — F32A Depression, unspecified: Secondary | ICD-10-CM

## 2016-05-20 LAB — TROPONIN I
Troponin I: <0.03 ng/mL (ref <0.03)
Troponin I: <0.03 ng/mL (ref <0.03)

## 2016-05-20 LAB — CBC WITH DIFFERENTIAL/PLATELET
Basophils Absolute: 0 10*3/uL (ref 0.0–0.1)
Basophils Relative: 0 %
Eosinophils Absolute: 0 10*3/uL (ref 0.0–0.7)
Eosinophils Relative: 0 %
HCT: 43.8 % (ref 36.0–46.0)
HEMOGLOBIN: 13.7 g/dL (ref 12.0–15.0)
LYMPHS PCT: 6 %
Lymphs Abs: 0.6 10*3/uL — ABNORMAL LOW (ref 0.7–4.0)
MCH: 29.9 pg (ref 26.0–34.0)
MCHC: 31.3 g/dL (ref 30.0–36.0)
MCV: 95.6 fL (ref 78.0–100.0)
MONO ABS: 0.6 10*3/uL (ref 0.1–1.0)
MONOS PCT: 6 %
NEUTROS PCT: 88 %
Neutro Abs: 8.9 10*3/uL — ABNORMAL HIGH (ref 1.7–7.7)
Platelets: 192 10*3/uL (ref 150–400)
RBC: 4.58 MIL/uL (ref 3.87–5.11)
RDW: 13 % (ref 11.5–15.5)
WBC: 10.1 10*3/uL (ref 4.0–10.5)

## 2016-05-20 LAB — LIPASE, BLOOD: Lipase: 24 U/L (ref 11–51)

## 2016-05-20 LAB — COMPREHENSIVE METABOLIC PANEL
ALBUMIN: 3.9 g/dL (ref 3.5–5.0)
ALT: 12 U/L — ABNORMAL LOW (ref 14–54)
ANION GAP: 10 (ref 5–15)
AST: 20 U/L (ref 15–41)
Alkaline Phosphatase: 52 U/L (ref 38–126)
BILIRUBIN TOTAL: 0.9 mg/dL (ref 0.3–1.2)
BUN: 22 mg/dL — AB (ref 6–20)
CALCIUM: 9.6 mg/dL (ref 8.9–10.3)
CO2: 25 mmol/L (ref 22–32)
Chloride: 102 mmol/L (ref 101–111)
Creatinine, Ser: 0.82 mg/dL (ref 0.44–1.00)
GFR calc Af Amer: >60 mL/min (ref >60)
GLUCOSE: 111 mg/dL — AB (ref 65–99)
POTASSIUM: 4.1 mmol/L (ref 3.5–5.1)
Sodium: 137 mmol/L (ref 135–145)
Total Protein: 6.9 g/dL (ref 6.5–8.1)

## 2016-05-20 MED ORDER — BISACODYL 5 MG PO TBEC: 5.0000 mg | DELAYED_RELEASE_TABLET | Freq: Every day | ORAL | Status: DC | PRN

## 2016-05-20 MED ORDER — GI COCKTAIL ~~LOC~~
30.0000 mL | Freq: Once | ORAL | Status: AC
  Administered 2016-05-20: 30 mL via ORAL
  Filled 2016-05-20: qty 30

## 2016-05-20 MED ORDER — FAMOTIDINE IN NACL 20-0.9 MG/50ML-% IV SOLN
20.0000 mg | Freq: Two times a day (BID) | INTRAVENOUS | Status: DC
  Administered 2016-05-20 – 2016-05-21 (×2): 20 mg via INTRAVENOUS
  Filled 2016-05-20 (×3): qty 50

## 2016-05-20 MED ORDER — SODIUM CHLORIDE 0.9% FLUSH
3.0000 mL | Freq: Two times a day (BID) | INTRAVENOUS | Status: DC
  Administered 2016-05-20 – 2016-05-21 (×2): 3 mL via INTRAVENOUS

## 2016-05-20 MED ORDER — METOPROLOL TARTRATE 25 MG PO TABS
25.0000 mg | ORAL_TABLET | Freq: Two times a day (BID) | ORAL | Status: DC
Start: 2016-05-20 — End: 2016-05-21
  Administered 2016-05-20 – 2016-05-21 (×2): 25 mg via ORAL
  Filled 2016-05-20 (×2): qty 1

## 2016-05-20 MED ORDER — MORPHINE SULFATE (PF) 2 MG/ML IV SOLN: 2.0000 mg | INTRAVENOUS | Status: DC | PRN

## 2016-05-20 MED ORDER — ASPIRIN 325 MG PO TABS
325.0000 mg | ORAL_TABLET | Freq: Every day | ORAL | Status: DC
  Administered 2016-05-21: 325 mg via ORAL
  Filled 2016-05-20: qty 1

## 2016-05-20 MED ORDER — ACETAMINOPHEN 325 MG PO TABS: 650.0000 mg | ORAL_TABLET | ORAL | Status: DC | PRN

## 2016-05-20 MED ORDER — TRAZODONE HCL 50 MG PO TABS: 25.0000 mg | ORAL_TABLET | Freq: Every evening | ORAL | Status: DC | PRN

## 2016-05-20 MED ORDER — SERTRALINE HCL 50 MG PO TABS
50.0000 mg | ORAL_TABLET | Freq: Every day | ORAL | Status: DC
  Administered 2016-05-20 – 2016-05-21 (×2): 50 mg via ORAL
  Filled 2016-05-20 (×2): qty 1

## 2016-05-20 MED ORDER — GI COCKTAIL ~~LOC~~: 30.0000 mL | Freq: Four times a day (QID) | ORAL | Status: DC | PRN

## 2016-05-20 MED ORDER — ENOXAPARIN SODIUM 40 MG/0.4ML ~~LOC~~ SOLN
40.0000 mg | SUBCUTANEOUS | Status: DC
  Administered 2016-05-20: 40 mg via SUBCUTANEOUS
  Filled 2016-05-20 (×2): qty 0.4

## 2016-05-20 MED ORDER — ONDANSETRON HCL 4 MG/2ML IJ SOLN: 4.0000 mg | Freq: Four times a day (QID) | INTRAMUSCULAR | Status: DC | PRN

## 2016-05-20 NOTE — H&P (Signed)
History and Physical    Kimberly [name]:[id number] DOB: 1945-09-09 DOA: 05/20/2016  PCP: Mickie Hillier, MD  Patient coming from:   Urgent care  Chief Complaint:  Chest pain  HPI: Kimberly [name] is a 70 y.o. female with a medical history significant for hypertension. Patient woke up this morning at 5:30am chest pain. Pain occurs at rest but also with exertion. The pain was nonradiating, located over distal sternum into epigastric region. She had associated nausea and admits to some mild associated SOB. Pain was severe, patient went to Urgent Care who sent her here for evaluation. Patient denies history of GERD. No recent heavy lifting. Participating in rehab after shoulder surgery. Patient did fall a few days ago after her foot got trapped under a fence. She fell on knees, possibly hit her chest. Patient received sublingual nitroglycerin prior to ED arrival. She required a second sublingual nitroglycerin which resulted in significant improvement in her chest pain within 5-10 minutes. Patient takes no NSAIDs. Her weight has been stable..   Patient was admitted in 2012 for chest pain. Cardiac enzymes were negative, 2-D echo revealed ejection fraction of 65%, no wall motion abnormalities. She reports an unremarkable stress test following that admission.   ED Course:  On arrival patient's vital signs were stable, she was afebrile with normal oxygen saturations on room air. Serum chemistries unremarkable, liver function studies unremarkable, lipase pending , troponin less than 0.03, white count and hemoglobin normal Chest x-ray unremarkable  Review of Systems: As per HPI, otherwise 10 point review of systems negative.  Past Medical History:  Diagnosis Date  . Arthritis 07/06/11   right thumb  . Hypertension     Past Surgical History:  Procedure Laterality Date  . ABDOMINAL HYSTERECTOMY  approx. 2008  . BACK SURGERY  2013   lumb fusion  . BREAST LUMPECTOMY  appro. 1990's   left - benign  . COLONOSCOPY    . KNEE ARTHROSCOPY     both scopes  . SHOULDER ARTHROSCOPY  5/15   right  . SHOULDER ARTHROSCOPY WITH ROTATOR CUFF REPAIR AND SUBACROMIAL DECOMPRESSION Right 07/30/2014   Procedure: RIGHT SHOULDER ARTHROSCOPY WITH REVISION ACROMIOPLASTY, DEBRIDEMENT AND REVISION OPEN ROTATOR CUFF REPAIR   ;  Surgeon: Thera Flake., MD;  Location: [facility];  Service: Orthopedics;  Laterality: Right;  . TONSILLECTOMY      Social History   Social History  . Marital status: Married    Spouse name: N/A  . Number of children: N/A  . Years of education: N/A   Occupational History  . Not on file.   Social History Main Topics  . Smoking status: Former Smoker    Years: 15.00    Types: Cigarettes    Quit date: 09/05/1991  . Smokeless tobacco: Never Used  . Alcohol use Yes     Comment: 2 cocktails per night  . Drug use: No  . Sexual activity: Yes     Comment: postmenopausal   Other Topics Concern  . Not on file   Social History Narrative  . No narrative on file   Lives at home with husband. No assistive devices needed for ambulation. Nonsmoker. Allergies  Allergen Reactions  . Flexeril [Cyclobenzaprine Hcl] Other (See Comments)    Violently ill  . Penicillins Other (See Comments)    unsure  . Skelaxin Nausea And Vomiting    Family History  Problem Relation Age of Onset  . Cancer Brother   . Coronary artery disease  Father     Prior to Admission medications   Medication Sig Start Date End Date Taking? Authorizing Provider  cholecalciferol (VITAMIN D) 1000 UNITS tablet Take 1,000 Units by mouth daily.      Historical Provider, MD  diazepam (VALIUM) 2 MG tablet Take 1 tablet (2 mg total) by mouth every 6 (six) hours as needed for muscle spasms. 07/30/14   Joshua Chadwell, PA-C  metoprolol tartrate (LOPRESSOR) 25 MG tablet Take 1 tablet (25 mg total) by mouth 2 (two) times daily. 07/08/11 07/07/12  Rodolph Bong, MD  Multiple Vitamin  (MULTIVITAMIN) tablet Take 1 tablet by mouth daily.      Historical Provider, MD  oxyCODONE-acetaminophen (ROXICET) 5-325 MG per tablet Take 1-2 tablets by mouth every 4 (four) hours as needed for severe pain. 07/30/14   Joshua Chadwell, PA-C  sertraline (ZOLOFT) 50 MG tablet Take 50 mg by mouth daily.      Historical Provider, MD    Physical Exam: Vitals:   05/20/16 1119 05/20/16 1120 05/20/16 1130 05/20/16 1145  BP: (!) 138/106  136/64 141/73  Pulse:  68 66 66  Resp:   14 16  Temp:      SpO2:  99% 96% 96%    Constitutional:  Pleasant, well-developed white female in NAD, calm, comfortable Vitals:   05/20/16 1119 05/20/16 1120 05/20/16 1130 05/20/16 1145  BP: (!) 138/106  136/64 141/73  Pulse:  68 66 66  Resp:   14 16  Temp:      SpO2:  99% 96% 96%   Eyes: PER, lids and conjunctivae normal ENMT: Mucous membranes are moist. Posterior pharynx clear of any exudate or lesions..  Neck: normal, supple, no masses Respiratory: clear to auscultation bilaterally, no wheezing, no crackles. Normal respiratory effort. No accessory muscle use.  Cardiovascular: Regular rate and rhythm, no murmurs / rubs / gallops. No extremity edema. 2+ dorsal pedis pulses.   Abdomen: no tenderness, no masses palpated. No hepatomegaly. Bowel sounds positive.  Musculoskeletal: no clubbing / cyanosis. No joint deformity upper and lower extremities. Good ROM, no contractures. Normal muscle tone. No chest wall tenderness. Right kneecap bruised Skin: no rashes, lesions, ulcers.  Neurologic: CN 2-12 grossly intact. Sensation intact, Strength 5/5 in all 4.  Psychiatric: Normal judgment and insight. Alert and oriented x 3. Normal mood.   Labs on Admission: I have personally reviewed following labs and imaging studies   Radiological Exams on Admission: Dg Chest 2 View  Result Date: 05/20/2016 CLINICAL DATA:  Chest pain with EKG changes. EXAM: CHEST  2 VIEW COMPARISON:  08/26/2013 FINDINGS: Midline trachea. Normal  heart size. Atherosclerosis in the transverse aorta. No pleural effusion or pneumothorax. Suspect mild interstitial thickening, likely related to the history of remote smoking. This is mild and lower lobe predominant. No lobar consolidation. No congestive failure. IMPRESSION: No acute cardiopulmonary disease. Electronically Signed   By: Jeronimo Greaves M.D.   On: 05/20/2016 12:55   Dg Knee Complete 4 Views Right  Result Date: 05/20/2016 CLINICAL DATA:  Fall and pain.  Initial encounter. EXAM: RIGHT KNEE - COMPLETE 4+ VIEW COMPARISON:  None. FINDINGS: No acute fracture or dislocation. No joint effusion. Joint spaces are maintained for age. Chondrocalcinosis within the menisci. IMPRESSION: No acute osseous abnormality. Chondrocalcinosis in the menisci, consistent with calcium pyrophosphate deposition disease. Electronically Signed   By: Jeronimo Greaves M.D.   On: 05/20/2016 12:56    EKG: Independently reviewed.   EKG Interpretation  Date/Time:  Sunday May 20 2016 11:25:06  EDT Ventricular Rate:  64 PR Interval:    QRS Duration: 92 QT Interval:  411 QTC Calculation: 424 R Axis:   -3 Text Interpretation:  Sinus rhythm No significant change was found Confirmed by Manus GunningANCOUR  MD, STEPHEN [phone]([id number]) on 05/20/2016 11:29:34 AM       Assessment/Plan   Principal Problem:   Chest pain Active Problems:   Hyperlipidemia   Depression   HTN (hypertension)      Chest pain / epigastric pain, at rest and with exertion. Normal LFTs, lipase pending. No EKG changes. Troponin negative x1.  HEART score is 4. Patient has hypertension, family history of coronary artery disease. Her last cardiac evaluation was several years ago.          -Trace in observation-telemetry -Chest pain order set utilized -Give 324mg  ASA now -Trend troponin -echocardiogram -follow up on lipase results -GI cocktail for chest pain has been ordered -BID IV Pepcid -follow up on lipids, A1c -If pain persists and suspicion for cardiac  etiology is low consider abdominal imaging and / or GI evaluation.      Depression, stable. . -Continue home Zoloft  Hypertension, controlled. -Continue twice daily Lopressor   DVT prophylaxis:   Lovenox Code Status:     Full code   Family Communication:    Treatment plan discussed with  husband in the room and he understands and agrees with the plan. Disposition Plan:   Discharge home in 24-48 hours             Consults called:  None   Admission status:   Observation - Telemetry    Willette ClusterPaula Sabryn Preslar NP [facility] Pager [phone]  If 7PM-7AM, please contact night-coverage [email or web address] Password [email or web address]  05/20/2016, 1:22 PM

## 2016-05-20 NOTE — ED Triage Notes (Signed)
Pt arrives from urgent care via [facility] c/o CP.  EMS reports MD at Urgent care was concerned for changes to EKG.  Pt reports pain woke from sleep at 0530, reports central substernal pressure radiating to L shoulder blade.  EMS reports giving 324 ASA, 2 NTG.  NAD noted, resp e/u.

## 2016-05-20 NOTE — ED Provider Notes (Signed)
[facility]-EMERGENCY DEPT Provider Note   CSN: [id number] Arrival date & time: 05/20/16  1034     History   Chief Complaint Chief Complaint  Patient presents with  . Chest Pain    HPI Kimberly [name] is a 70 y.o. female.  Patient presents from urgent care with chest pain. This woke her from sleep this morning at 5:30 AM. It is central and radiating to her left shoulder blade. She was given aspirin and nitroglycerin by EMS with some improvement reports now her pain is another one. Urgent care told her she had EKG changes. She does not have any history of cardiac disease. Reports a negative stress test about 8 years ago. Patient states she fell 3 days ago onto her hands and knees and was concerned that she may injure her chest from the fall but she did not have any pain until today. The pain is associated with shortness of breath, nausea, diaphoresis and near syncope. She feels improved with nitroglycerin. Denies any abdominal pain or vomiting. Denies any focal weakness, numbness or tingling.   The history is provided by the patient. The history is limited by the condition of the patient.  Chest Pain   Associated symptoms include diaphoresis, dizziness, nausea and shortness of breath. Pertinent negatives include no abdominal pain, no back pain, no headaches, no palpitations, no vomiting and no weakness.    Past Medical History:  Diagnosis Date  . Arthritis 07/06/11   right thumb  . Hypertension     Patient Active Problem List   Diagnosis Date Noted  . Hyperlipidemia 07/08/2011  . Chest pain 07/06/2011  . HTN (hypertension) 07/06/2011    Past Surgical History:  Procedure Laterality Date  . ABDOMINAL HYSTERECTOMY  approx. 2008  . BACK SURGERY  2013   lumb fusion  . BREAST LUMPECTOMY  appro. 1990's   left - benign  . COLONOSCOPY    . KNEE ARTHROSCOPY     both scopes  . SHOULDER ARTHROSCOPY  5/15   right  . SHOULDER ARTHROSCOPY WITH ROTATOR CUFF REPAIR AND SUBACROMIAL  DECOMPRESSION Right 07/30/2014   Procedure: RIGHT SHOULDER ARTHROSCOPY WITH REVISION ACROMIOPLASTY, DEBRIDEMENT AND REVISION OPEN ROTATOR CUFF REPAIR   ;  Surgeon: Thera FlakeW D Caffrey Jr., MD;  Location: [facility];  Service: Orthopedics;  Laterality: Right;  . TONSILLECTOMY      OB History    No data available       Home Medications    Prior to Admission medications   Medication Sig Start Date End Date Taking? Authorizing Provider  cholecalciferol (VITAMIN D) 1000 UNITS tablet Take 1,000 Units by mouth daily.      Historical Provider, MD  diazepam (VALIUM) 2 MG tablet Take 1 tablet (2 mg total) by mouth every 6 (six) hours as needed for muscle spasms. 07/30/14   Joshua Chadwell, PA-C  metoprolol tartrate (LOPRESSOR) 25 MG tablet Take 1 tablet (25 mg total) by mouth 2 (two) times daily. 07/08/11 07/07/12  Rodolph Bonganiel V Thompson, MD  Multiple Vitamin (MULTIVITAMIN) tablet Take 1 tablet by mouth daily.      Historical Provider, MD  oxyCODONE-acetaminophen (ROXICET) 5-325 MG per tablet Take 1-2 tablets by mouth every 4 (four) hours as needed for severe pain. 07/30/14   Joshua Chadwell, PA-C  sertraline (ZOLOFT) 50 MG tablet Take 50 mg by mouth daily.      Historical Provider, MD    Family History Family History  Problem Relation Age of Onset  . Cancer Brother   .  Coronary artery disease Father     Social History Social History  Substance Use Topics  . Smoking status: Former Smoker    Years: 15.00    Types: Cigarettes    Quit date: 09/05/1991  . Smokeless tobacco: Never Used  . Alcohol use Yes     Comment: 2 cocktails per night     Allergies   Flexeril [cyclobenzaprine hcl]; Penicillins; and Skelaxin   Review of Systems Review of Systems  Constitutional: Positive for diaphoresis and fatigue. Negative for activity change and appetite change.  HENT: Negative for congestion and rhinorrhea.   Eyes: Negative for visual disturbance.  Respiratory: Positive for chest tightness  and shortness of breath.   Cardiovascular: Positive for chest pain. Negative for palpitations.  Gastrointestinal: Positive for nausea. Negative for abdominal pain and vomiting.  Genitourinary: Negative for dysuria and hematuria.  Musculoskeletal: Negative for arthralgias, back pain and myalgias.  Neurological: Positive for dizziness and light-headedness. Negative for weakness and headaches.  A complete 10 system review of systems was obtained and all systems are negative except as noted in the HPI and PMH.     Physical Exam Updated Vital Signs BP (!) 133/54 (BP Location: Right Arm)   Pulse 73   Temp 98.6 F (37 C)   Resp 16   SpO2 95%   Physical Exam  Constitutional: She is oriented to person, place, and time. She appears well-developed and well-nourished. No distress.  HENT:  Head: Normocephalic and atraumatic.  Mouth/Throat: Oropharynx is clear and moist. No oropharyngeal exudate.  Eyes: Conjunctivae and EOM are normal. Pupils are equal, round, and reactive to light.  Neck: Normal range of motion. Neck supple.  No meningismus.  Cardiovascular: Normal rate, regular rhythm, normal heart sounds and intact distal pulses.   No murmur heard. Pulmonary/Chest: Effort normal and breath sounds normal. No respiratory distress. She exhibits no tenderness.  Abdominal: Soft. There is no tenderness. There is no rebound and no guarding.  Musculoskeletal: Normal range of motion. She exhibits no edema or tenderness.  Ecchymosis to medial R knee.  FROM. Intact DP and PT pulses  Neurological: She is alert and oriented to person, place, and time. No cranial nerve deficit. She exhibits normal muscle tone. Coordination normal.   5/5 strength throughout. CN 2-12 intact.Equal grip strength.   Skin: Skin is warm.  Psychiatric: She has a normal mood and affect. Her behavior is normal.  Nursing note and vitals reviewed.    ED Treatments / Results  Labs (all labs ordered are listed, but only abnormal  results are displayed) Labs Reviewed  CBC WITH DIFFERENTIAL/PLATELET - Abnormal; Notable for the following:       Result Value   Neutro Abs 8.9 (*)    Lymphs Abs 0.6 (*)    All other components within normal limits  COMPREHENSIVE METABOLIC PANEL - Abnormal; Notable for the following:    Glucose, Bld 111 (*)    BUN 22 (*)    ALT 12 (*)    All other components within normal limits  TROPONIN I  LIPASE, BLOOD    EKG  EKG Interpretation  Date/Time:  Sunday May 20 2016 11:25:06 EDT Ventricular Rate:  64 PR Interval:    QRS Duration: 92 QT Interval:  411 QTC Calculation: 424 R Axis:   -3 Text Interpretation:  Sinus rhythm No significant change was found Confirmed by Manus Gunning  MD, Willaim Mode [phone]) on 05/20/2016 11:29:34 AM       Radiology Dg Chest 2 View  Result Date:  05/20/2016 CLINICAL DATA:  Chest pain with EKG changes. EXAM: CHEST  2 VIEW COMPARISON:  08/26/2013 FINDINGS: Midline trachea. Normal heart size. Atherosclerosis in the transverse aorta. No pleural effusion or pneumothorax. Suspect mild interstitial thickening, likely related to the history of remote smoking. This is mild and lower lobe predominant. No lobar consolidation. No congestive failure. IMPRESSION: No acute cardiopulmonary disease. Electronically Signed   By: Jeronimo Greaves M.D.   On: 05/20/2016 12:55   Dg Knee Complete 4 Views Right  Result Date: 05/20/2016 CLINICAL DATA:  Fall and pain.  Initial encounter. EXAM: RIGHT KNEE - COMPLETE 4+ VIEW COMPARISON:  None. FINDINGS: No acute fracture or dislocation. No joint effusion. Joint spaces are maintained for age. Chondrocalcinosis within the menisci. IMPRESSION: No acute osseous abnormality. Chondrocalcinosis in the menisci, consistent with calcium pyrophosphate deposition disease. Electronically Signed   By: Jeronimo Greaves M.D.   On: 05/20/2016 12:56    Procedures Procedures (including critical care time)  Medications Ordered in ED Medications - No data to  display   Initial Impression / Assessment and Plan / ED Course  I have reviewed the triage vital signs and the nursing notes.  Pertinent labs & imaging results that were available during my care of the patient were reviewed by me and considered in my medical decision making (see chart for details).  Clinical Course  Chest pain since this morning, radiating to L shoulder.  Associated with nausea, diaphoresis, shortness of breath.   EKG without STEMI.   Troponin negative. Heart score is 4. EKG is not significantly changed from previous. Patient's story is concerning with chest pain diaphoresis nausea and shortness of breath. Plan admission for chest pain rule out.  Patient now states she's has some epigastric pain and thechest pain has moved lower. LFTs are normal. Lipase will be added on. GI cocktail will be given.  Admission discussed with Gunnar Fusi NP.    Final Clinical Impressions(s) / ED Diagnoses   Final diagnoses:  Chest pain, unspecified chest pain type    New Prescriptions New Prescriptions   No medications on file     Glynn Octave, MD 05/20/16 1348

## 2016-05-21 ENCOUNTER — Observation Stay (HOSPITAL_BASED_OUTPATIENT_CLINIC_OR_DEPARTMENT_OTHER): Payer: Medicare Other

## 2016-05-21 ENCOUNTER — Observation Stay (HOSPITAL_COMMUNITY): Payer: Medicare Other

## 2016-05-21 DIAGNOSIS — I10 Essential (primary) hypertension: Secondary | ICD-10-CM | POA: Diagnosis not present

## 2016-05-21 DIAGNOSIS — E785 Hyperlipidemia, unspecified: Secondary | ICD-10-CM

## 2016-05-21 DIAGNOSIS — R079 Chest pain, unspecified: Secondary | ICD-10-CM | POA: Diagnosis not present

## 2016-05-21 DIAGNOSIS — R0789 Other chest pain: Secondary | ICD-10-CM

## 2016-05-21 LAB — ECHOCARDIOGRAM COMPLETE
E decel time: 250 msec
E/e' ratio: 16.22
FS: 31 % (ref 28–44)
HEIGHTINCHES: 62 in
IV/PV OW: 1.74
LADIAMINDEX: 2.17 cm/m2
LASIZE: 33 mm
LAVOL: 54 mL
LAVOLA4C: 42.9 mL
LAVOLIN: 35.5 mL/m2
LDCA: 3.14 cm2
LEFT ATRIUM END SYS DIAM: 33 mm
LV E/e' medial: 16.22
LV e' LATERAL: 5.77 cm/s
LVEEAVG: 16.22
LVOT SV: 59 mL
LVOT VTI: 18.7 cm
LVOT peak vel: 81.3 cm/s
LVOTD: 20 mm
MV Dec: 250
MV Peak grad: 4 mmHg
MVPKAVEL: 104 m/s
MVPKEVEL: 93.6 m/s
PW: 7.03 mm — AB (ref 0.6–1.1)
TAPSE: 23 mm
TDI e' lateral: 5.77
TDI e' medial: 5.55
WEIGHTICAEL: 1872 [oz_av]

## 2016-05-21 LAB — BASIC METABOLIC PANEL
Anion gap: 8 (ref 5–15)
BUN: 19 mg/dL (ref 6–20)
CALCIUM: 9.1 mg/dL (ref 8.9–10.3)
CHLORIDE: 102 mmol/L (ref 101–111)
CO2: 27 mmol/L (ref 22–32)
CREATININE: 0.88 mg/dL (ref 0.44–1.00)
GFR calc Af Amer: >60 mL/min (ref >60)
GFR calc non Af Amer: >60 mL/min (ref >60)
Glucose, Bld: 102 mg/dL — ABNORMAL HIGH (ref 65–99)
Potassium: 3.7 mmol/L (ref 3.5–5.1)
SODIUM: 137 mmol/L (ref 135–145)

## 2016-05-21 LAB — CBC
HCT: 40.5 % (ref 36.0–46.0)
Hemoglobin: 13 g/dL (ref 12.0–15.0)
MCH: 30.5 pg (ref 26.0–34.0)
MCHC: 32.1 g/dL (ref 30.0–36.0)
MCV: 95.1 fL (ref 78.0–100.0)
PLATELETS: 188 10*3/uL (ref 150–400)
RBC: 4.26 MIL/uL (ref 3.87–5.11)
RDW: 13.3 % (ref 11.5–15.5)
WBC: 8.3 10*3/uL (ref 4.0–10.5)

## 2016-05-21 LAB — LIPID PANEL
Cholesterol: 277 mg/dL — ABNORMAL HIGH (ref 0–200)
HDL: 77 mg/dL (ref >40)
LDL Cholesterol: 177 mg/dL — ABNORMAL HIGH (ref 0–99)
Total CHOL/HDL Ratio: 3.6 RATIO
Triglycerides: 113 mg/dL (ref <150)
VLDL: 23 mg/dL (ref 0–40)

## 2016-05-21 LAB — HEMOGLOBIN A1C
Hgb A1c MFr Bld: 5.2 % (ref 4.8–5.6)
Mean Plasma Glucose: 103 mg/dL

## 2016-05-21 MED ORDER — OMEPRAZOLE 20 MG PO CPDR: 20.0000 mg | DELAYED_RELEASE_CAPSULE | Freq: Every day | ORAL | 0 refills | Status: DC

## 2016-05-21 MED ORDER — FAMOTIDINE 20 MG PO TABS
20.0000 mg | ORAL_TABLET | Freq: Two times a day (BID) | ORAL | Status: DC
  Administered 2016-05-21: 20 mg via ORAL
  Filled 2016-05-21: qty 1

## 2016-05-21 MED ORDER — SIMVASTATIN 40 MG PO TABS: 40.0000 mg | ORAL_TABLET | Freq: Every day | ORAL | 0 refills | Status: DC

## 2016-05-21 MED ORDER — NITROGLYCERIN 0.4 MG SL SUBL: 0.4000 mg | SUBLINGUAL_TABLET | SUBLINGUAL | 0 refills | Status: DC | PRN

## 2016-05-21 MED ORDER — RANITIDINE HCL 150 MG PO TABS: 150.0000 mg | ORAL_TABLET | Freq: Two times a day (BID) | ORAL | 0 refills | Status: DC

## 2016-05-21 MED ORDER — ASPIRIN EC 81 MG PO TBEC: 81.0000 mg | DELAYED_RELEASE_TABLET | Freq: Every day | ORAL | 0 refills | Status: DC

## 2016-05-21 NOTE — Progress Notes (Signed)
  Echocardiogram 2D Echocardiogram has been performed.  Leta JunglingCooper, Sriman Tally M 05/21/2016, 1:36 PM

## 2016-05-21 NOTE — Care Management Obs Status (Signed)
MEDICARE OBSERVATION STATUS NOTIFICATION   Patient Details  Name: Kimberly [name] MRN: [id number] Date of Birth: 09/14/1945   Medicare Observation Status Notification Given:  Yes    Darrold SpanWebster, Averie Meiner Hall, RN 05/21/2016, 4:32 PM

## 2016-05-21 NOTE — Discharge Summary (Signed)
Discharge Summary  Kimberly [name]:[id number] DOB: 11/01/1945  PCP: Mickie Hillier, MD  Admit date: 05/20/2016 Discharge date: 05/21/2016  Time spent: <92mins  Recommendations for Outpatient Follow-up:  1. F/u with PMD within a week  for hospital discharge follow up, repeat cbc/bmp at follow up 2. F/u with cardiology for outpatient stress test, I have talked to cardiology coordinator Trish. 3. F/u with GI , Dr Ewing Schlein, consider EGD  Discharge Diagnoses:  Active Hospital Problems   Diagnosis Date Noted  . Chest pain 05/20/2016  . Depression 05/20/2016  . Hyperlipidemia 07/08/2011  . HTN (hypertension) 07/06/2011    Resolved Hospital Problems   Diagnosis Date Noted Date Resolved  No resolved problems to display.    Discharge Condition: stable  Diet recommendation: heart healthy  Filed Weights   05/20/16 1609 05/20/16 1710 05/21/16 0523  Weight: 52.8 kg (116 lb 6.4 oz) 52.8 kg (116 lb 6.4 oz) 53.1 kg (117 lb)    History of present illness:  Patient coming from:   Urgent care  Chief Complaint:  Chest pain  HPI: Kimberly [name] is a 70 y.o. female with a medical history significant for hypertension. Patient woke up this morning at 5:30am chest pain. Pain occurs at rest but also with exertion. The pain was nonradiating, located over distal sternum into epigastric region. She had associated nausea and admits to some mild associated SOB. Pain was severe, patient went to Urgent Care who sent her here for evaluation. Patient denies history of GERD. No recent heavy lifting. Participating in rehab after shoulder surgery. Patient did fall a few days ago after her foot got trapped under a fence. She fell on knees, possibly hit her chest. Patient received sublingual nitroglycerin prior to ED arrival. She required a second sublingual nitroglycerin which resulted in significant improvement in her chest pain within 5-10 minutes. Patient takes no NSAIDs. Her weight has been  stable..   Patient was admitted in 2012 for chest pain. Cardiac enzymes were negative, 2-D echo in 2012 revealed ejection fraction of 65%, no wall motion abnormalities. She reports an unremarkable stress test following that admission.   ED Course:  On arrival patient's vital signs were stable, she was afebrile with normal oxygen saturations on room air. Serum chemistries unremarkable, liver function studies unremarkable, lipase pending , troponin less than 0.03, white count and hemoglobin normal Chest x-ray unremarkable  She is kept in the hospital , cardiac enzyme negative, her chest pain has resolved. She report chest pain responded to nitroglycerin and gi cocktail.   Hospital Course:  Principal Problem:   Chest pain Active Problems:   HTN (hypertension)   Hyperlipidemia   Depression  Chest pain / epigastric pain, at rest and with exertion. Normal LFTs, lipase wnl. No EKG changes. Troponin negative x3.  HEART score is 4. Patient has hypertension, family history of coronary artery disease ( her father). Her last cardiac evaluation was several years ago.          -telemetry unremarkable, a1c 5.2, ldl 177 -echocardiogram lvef 60-65%, ? Trivial pericardial effusion,  -outpatient stress test, she is to follow up with cardiology.   Chest pain:  possible gi related, she report significant amount of daily spicy food intake, she report intermittent chest pain for the last two years, mostly at night and early morning, hgb stable, no n/v, no dysphagia, no odynophagia, no weight loss, BM normal. She report chest pain relieved by nitroglycerin and gi cocktail.  Advise avoid spicy food, trial of omeprazole and  zantac, dg esophagram unremarkable Outpatient gi follow up   HLD: ldl 177, HDL 77, she report take zocor occasionally prior to hospitalization, advise her to take zocor daily.   Depression, stable. . -Continue home Zoloft  Hypertension, controlled. -Continue twice daily Lopressor,  started asa 81mg  po qd.   DVT prophylaxis while in the hospital:   Lovenox Code Status:     Full code   Family Communication:    Treatment plan discussed with  husband in the room and he understands and agrees with the plan. Disposition Plan:   Discharge home with outpatient cardiology follow up          Consults called:  None     Discharge Exam: BP 120/76 (BP Location: Right Arm)   Pulse (!) 57   Temp 97.6 F (36.4 C) (Oral)   Resp 18   Ht 5\' 2"  (1.575 m)   Wt 53.1 kg (117 lb)   SpO2 99%   BMI 21.40 kg/m   General: NAD Cardiovascular: RRR Respiratory: CTABL Ab; no epigastric tenderness  Discharge Instructions You were cared for by a hospitalist during your hospital stay. If you have any questions about your discharge medications or the care you received while you were in the hospital after you are discharged, you can call the unit and asked to speak with the hospitalist on call if the hospitalist that took care of you is not available. Once you are discharged, your primary care physician will handle any further medical issues. Please note that NO REFILLS for any discharge medications will be authorized once you are discharged, as it is imperative that you return to your primary care physician (or establish a relationship with a primary care physician if you do not have one) for your aftercare needs so that they can reassess your need for medications and monitor your lab values.  Discharge Instructions    Diet - low sodium heart healthy    Complete by:  As directed    Low fat diet   Increase activity slowly    Complete by:  As directed        Medication List    STOP taking these medications   diazepam 2 MG tablet Commonly known as:  VALIUM   oxyCODONE-acetaminophen 5-325 MG tablet Commonly known as:  ROXICET     TAKE these medications   aspirin EC 81 MG tablet Take 1 tablet (81 mg total) by mouth daily.   cholecalciferol 1000 units tablet Commonly known as:   VITAMIN D Take 1,000 Units by mouth daily.   metoprolol tartrate 25 MG tablet Commonly known as:  LOPRESSOR Take 1 tablet (25 mg total) by mouth 2 (two) times daily.   multivitamin tablet Take 1 tablet by mouth daily.   nitroGLYCERIN 0.4 MG SL tablet Commonly known as:  NITROSTAT Place 1 tablet (0.4 mg total) under the tongue every 5 (five) minutes as needed for chest pain.   omeprazole 20 MG capsule Commonly known as:  PRILOSEC Take 1 capsule (20 mg total) by mouth daily.   ranitidine 150 MG tablet Commonly known as:  ZANTAC Take 1 tablet (150 mg total) by mouth 2 (two) times daily.   sertraline 50 MG tablet Commonly known as:  ZOLOFT Take 50 mg by mouth daily.   simvastatin 40 MG tablet Commonly known as:  ZOCOR Take 1 tablet (40 mg total) by mouth daily.      Allergies  Allergen Reactions  . Flexeril [Cyclobenzaprine Hcl] Other (See Comments)  Violently ill  . Penicillins Other (See Comments)    unsure  . Skelaxin Nausea And Vomiting   Follow-up Information    Mickie HillierLITTLE,KEVIN LORNE, MD Follow up in 1 week(s).   Specialty:  Family Medicine Why:  hospital discharge follow up, please have your primary care doctor arrange outpatient stress test Contact information: [address] [place] [address] [phone]        [facility], MD Follow up in 1 month(s).   Specialty:  Gastroenterology Why:  acid reflux Contact information: [address] [place] [address] [phone]        [facility] .   Why:  outpatient stress test Contact information: [address] [place] [phone]           The results of significant diagnostics from this hospitalization (including imaging, microbiology, ancillary and laboratory) are listed below for reference.    Significant Diagnostic Studies: Dg Chest 2 View  Result Date: 05/20/2016 CLINICAL  DATA:  Chest pain with EKG changes. EXAM: CHEST  2 VIEW COMPARISON:  08/26/2013 FINDINGS: Midline trachea. Normal heart size. Atherosclerosis in the transverse aorta. No pleural effusion or pneumothorax. Suspect mild interstitial thickening, likely related to the history of remote smoking. This is mild and lower lobe predominant. No lobar consolidation. No congestive failure. IMPRESSION: No acute cardiopulmonary disease. Electronically Signed   By: Jeronimo GreavesKyle  Talbot M.D.   On: 05/20/2016 12:55   Dg Esophagus  Result Date: 05/21/2016 CLINICAL DATA:  Patient with centralized chest pain and dysphagia. EXAM: ESOPHOGRAM / BARIUM SWALLOW / BARIUM TABLET STUDY TECHNIQUE: Combined double contrast and single contrast examination performed using effervescent crystals, thick barium liquid, and thin barium liquid. The patient was observed with fluoroscopy swallowing a 13 mm barium sulphate tablet. FLUOROSCOPY TIME:  Fluoroscopy Time:  2 minutes, 48 seconds COMPARISON:  Chest radiograph 05/20/2016. FINDINGS: Contrast flows freely throughout the esophagus without evidence for stricture or mass lesion. Normal esophageal motility. No hiatal hernia. No evidence for spontaneous or inducible gastroesophageal reflux. The barium tablet passed into the stomach appropriately. IMPRESSION: Unremarkable esophagram without evidence for spontaneous or inducible gastroesophageal reflux. Electronically Signed   By: Annia Beltrew  Davis M.D.   On: 05/21/2016 15:53   Dg Knee Complete 4 Views Right  Result Date: 05/20/2016 CLINICAL DATA:  Fall and pain.  Initial encounter. EXAM: RIGHT KNEE - COMPLETE 4+ VIEW COMPARISON:  None. FINDINGS: No acute fracture or dislocation. No joint effusion. Joint spaces are maintained for age. Chondrocalcinosis within the menisci. IMPRESSION: No acute osseous abnormality. Chondrocalcinosis in the menisci, consistent with calcium pyrophosphate deposition disease. Electronically Signed   By: Jeronimo GreavesKyle  Talbot M.D.   On:  05/20/2016 12:56    Microbiology: No results found for this or any previous visit (from the past 240 hour(s)).   Labs: Basic Metabolic Panel:  Recent Labs Lab 05/20/16 1113 05/21/16 0302  NA 137 137  K 4.1 3.7  CL 102 102  CO2 25 27  GLUCOSE 111* 102*  BUN 22* 19  CREATININE 0.82 0.88  CALCIUM 9.6 9.1   Liver Function Tests:  Recent Labs Lab 05/20/16 1113  AST 20  ALT 12*  ALKPHOS 52  BILITOT 0.9  PROT 6.9  ALBUMIN 3.9    Recent Labs Lab 05/20/16 1551  LIPASE 24   No results for input(s): AMMONIA in the last 168 hours. CBC:  Recent Labs Lab 05/20/16 1113 05/21/16 0302  WBC 10.1 8.3  NEUTROABS  8.9*  --   HGB 13.7 13.0  HCT 43.8 40.5  MCV 95.6 95.1  PLT 192 188   Cardiac Enzymes:  Recent Labs Lab 05/20/16 1113 05/20/16 1551 05/20/16 2157  TROPONINI <0.03 <0.03 <0.03   BNP: BNP (last 3 results) No results for input(s): BNP in the last 8760 hours.  ProBNP (last 3 results) No results for input(s): PROBNP in the last 8760 hours.  CBG: No results for input(s): GLUCAP in the last 168 hours.     SignedAlbertine Grates MD, PhD  [facility] 05/21/2016, 5:46 PM

## 2016-09-26 ENCOUNTER — Ambulatory Visit (INDEPENDENT_AMBULATORY_CARE_PROVIDER_SITE_OTHER): Payer: Medicare Other

## 2016-09-26 ENCOUNTER — Encounter: Payer: Self-pay | Admitting: Podiatry

## 2016-09-26 ENCOUNTER — Ambulatory Visit (INDEPENDENT_AMBULATORY_CARE_PROVIDER_SITE_OTHER): Payer: Medicare Other | Admitting: Podiatry

## 2016-09-26 VITALS — BP 183/95 | HR 71 | Resp 18

## 2016-09-26 DIAGNOSIS — M6790 Unspecified disorder of synovium and tendon, unspecified site: Secondary | ICD-10-CM | POA: Diagnosis not present

## 2016-09-26 DIAGNOSIS — R52 Pain, unspecified: Secondary | ICD-10-CM | POA: Diagnosis not present

## 2016-09-26 DIAGNOSIS — M67969 Unspecified disorder of synovium and tendon, unspecified lower leg: Secondary | ICD-10-CM

## 2016-09-26 NOTE — Patient Instructions (Signed)
Today your examination demonstrated tenderness over the inside of the left ankle when the tendons were palpated. Also these tendons were uncomfortable when you sit on your toes. Please wear the protective boot on your left lower leg/foot when standing and walking on an ongoing continuous basis Return 6 weeks

## 2016-09-26 NOTE — Progress Notes (Signed)
   Subjective:    Patient ID: Kimberly [name], female    DOB: 10/04/1945, 71 y.o.   MRN: [id number]  HPI     This patient presents today with approximately two-month history of pain in or around the medial left ankle and midfoot. She describes her foot is wanting to turn out and uncomfortable. Patient participated in a sport called pick up all, a variation of tenderness using a soft ball in the past 24 hours. After this activity patient noticed increased pain and bruising in her medial left ankle, as well as burning and throbbing in that area.  Review of Systems  All other systems reviewed and are negative.      Objective:   Physical Exam  Orientated 3  Vascular: No calf edema or calf tenderness bilaterally DP and PT pulses 2/4 bilaterally Capillary reflex immediate bilaterally  Neurological: Sensation to 10 g monofilament wire intact 5/5 bilaterally Vibratory sensation reactive bilaterally Ankle reflex equal reactive bilaterally  Dermatological: No open skin lesions bilaterally Ecchymosis medial left ankle  Musculoskeletal: Upon weight-bearing patient able to heel off unilaterally and bilaterally He complains of discomfort on heel off on the left No too many toes signs bilaterally Palpation distal medial lower leg, inferior medial malleolus to the medial navicular area elicits palpable discomfort without any palpable lesions  X-ray examination weightbearing left ankle dated 09/26/2016 Intact bony structure of fracture and/or dislocation Ankle mortise within normal limits  Radiographic impression: No acute bony abnormality noted in the weightbearing x-ray the left ankle dated 09/26/2016  X-ray examination weightbearing left foot dated 09/26/2016  Intact bony structures without fracture and/or dislocation No increased soft tissue density noted  Radiographic impression: No acute bony abnormality noted weightbearing x-ray left foot dated 09/26/2016       Assessment & Plan:   Assessment: Medial left lower leg tendinopathy  Plan: I reviewed the results of exam an x-ray with patient today and recommended a Cam Walker pneumatic boot to wear on left lower extremity when walking and standing on an ongoing continuous basis. Patient will return 6 weeks for further evaluation. If the symptoms are not improving with protective weightbearing will consider MRI image of left ankle and left rear foot  Reappoint 6 weeks

## 2016-11-06 ENCOUNTER — Telehealth: Payer: Self-pay | Admitting: *Deleted

## 2016-11-06 ENCOUNTER — Encounter: Payer: Self-pay | Admitting: Podiatry

## 2016-11-06 ENCOUNTER — Ambulatory Visit (INDEPENDENT_AMBULATORY_CARE_PROVIDER_SITE_OTHER): Payer: Medicare Other | Admitting: Podiatry

## 2016-11-06 VITALS — BP 133/78 | HR 78 | Temp 97.6°F

## 2016-11-06 DIAGNOSIS — R52 Pain, unspecified: Secondary | ICD-10-CM | POA: Diagnosis not present

## 2016-11-06 DIAGNOSIS — M67969 Unspecified disorder of synovium and tendon, unspecified lower leg: Secondary | ICD-10-CM

## 2016-11-06 DIAGNOSIS — M6790 Unspecified disorder of synovium and tendon, unspecified site: Secondary | ICD-10-CM | POA: Diagnosis not present

## 2016-11-06 NOTE — Patient Instructions (Addendum)
Continue wearing the boot on the left lower leg/ankle pending MRI report The imaging center will contact you to schedule an appointment Follow-up with Dr. Logan BoresEvans or Dr. Loreta AveWagner

## 2016-11-06 NOTE — Telephone Encounter (Addendum)
-----   Message from Marylou MccoyJessica L Quintana, RN sent at 11/06/2016  1:01 PM EDT ----- MRI orders in for Lt lower leg and left ankle both w/o contrast. [facility] to [facility] 11/13/2016-Pt called for results of MRI. I informed pt that Dr. Leeanne Deeduchman was not in the office and he had recommended she make an appt with Dr. Ardelle AntonWagoner or Dr. Logan BoresEvans for the review of the results. Pt states understanding and I told her I would have schedulers call in the morning. 11/26/2016-Pt states there is a white strap in the [employer] brace box that was not applied on Friday, does she need to use it. I told pt the brace is used for different problems and she would not need it for her current condition.

## 2016-11-06 NOTE — Addendum Note (Signed)
Addended by: Marylou MccoyQUINTANA, Nijel Flink L on: 11/06/2016 01:42 PM   Modules accepted: Orders

## 2016-11-06 NOTE — Progress Notes (Signed)
   Subjective:    Patient ID: Kimberly [name], female    DOB: 02/12/1946, 71 y.o.   MRN: [id number]  HPI    Review of Systems  All other systems reviewed and are negative.      Objective:   Physical Exam        Assessment & Plan:

## 2016-11-06 NOTE — Progress Notes (Signed)
Patient ID: Kimberly [name], female   DOB: 04/01/1946, 71 y.o.   MRN: [id number]   Subjective: Patient presents for follow-up visit of 09/26/2016. Patient states that when she wears the Cam Walker boot on the left foot/ankle she has minimal to no discomfort in the medial ankle and the medial left lower leg area. When she takes the boot off She complains of discomfort in the distal third medial left lower leg and ankle.

## 2016-11-06 NOTE — Progress Notes (Signed)
Patient ID: Kimberly [name], female   DOB: 12/19/1945, 71 y.o.   MRN: [id number]    Subjective: Patient presents for follow-up visit of 09/26/2016. Patient states that when she wears the Cam Walker boot on the left foot/ankle she has minimal to no discomfort in the medial ankle and the medial left lower leg area. When she takes the boot off She complains of discomfort in the distal third medial left lower leg and ankle.  ROS negative for all systems   Orientated 3  Vascular: No peripheral edema bilaterally No calf edema or calf tenderness bilaterally DP and PT pulses 2/4 bilaterally Capillary reflex immediate bilaterally  Neurological: Sensation to 10 g monofilament wire intact 5/5 bilaterally Vibratory sensation reactive bilaterally Ankle reflex equal reactive bilaterally  Dermatological: No open skin lesions bilaterally Ecchymosis medial left ankle  Musculoskeletal: Upon weight-bearing patient able to heel off unilaterally and bilaterally He complains of discomfort on heel off on the left No too many toes signs bilaterally Palpation distal medial lower leg, inferior medial malleolus to the medial navicular area elicits palpable discomfort without any palpable lesions Patient able to stand on heels, toes bilaterally Manual motor testing: Dorsi flexion, plantar flexion, inversion, eversion 5/5 bilaterally  X-ray examination weightbearing left ankle dated 09/26/2016 Intact bony structure of fracture and/or dislocation Ankle mortise within normal limits  Radiographic impression: No acute bony abnormality noted in the weightbearing x-ray the left ankle dated 09/26/2016  Assessment: Medial left lower leg tendinopathy Medial lower left stress reaction  Plan: Instructed patient to maintain Cam Walker boot Ordered noncontrast MRI left lower leg/ankle Return patient upon receipt of MRI image Informed patient that I will be out of the office next several weeks and  follow-up on the MRI report with Dr. Logan BoresEvans or Dr. Loreta AveWagner.

## 2016-11-11 ENCOUNTER — Ambulatory Visit
Admission: RE | Admit: 2016-11-11 | Discharge: 2016-11-11 | Disposition: A | Discharge status: Home / Self Care | Payer: Medicare Other | Source: Ambulatory Visit | Attending: Podiatry | Admitting: Podiatry

## 2016-11-11 DIAGNOSIS — M6790 Unspecified disorder of synovium and tendon, unspecified site: Secondary | ICD-10-CM

## 2016-11-11 DIAGNOSIS — R52 Pain, unspecified: Secondary | ICD-10-CM

## 2016-11-12 ENCOUNTER — Telehealth: Payer: Self-pay | Admitting: Podiatry

## 2016-11-12 NOTE — Telephone Encounter (Signed)
Pt calling for mri results.She is a Dr Leeanne Deeduchman pt and he told her he would get her in to see a different doctor and not have to wait to see him.

## 2016-11-15 ENCOUNTER — Encounter: Payer: Self-pay | Admitting: Podiatrist

## 2016-11-15 ENCOUNTER — Ambulatory Visit (INDEPENDENT_AMBULATORY_CARE_PROVIDER_SITE_OTHER): Payer: Medicare Other | Admitting: Podiatrist

## 2016-11-15 DIAGNOSIS — M2140 Flat foot [pes planus] (acquired), unspecified foot: Secondary | ICD-10-CM | POA: Diagnosis not present

## 2016-11-15 DIAGNOSIS — M6790 Unspecified disorder of synovium and tendon, unspecified site: Secondary | ICD-10-CM | POA: Diagnosis not present

## 2016-11-15 DIAGNOSIS — M76829 Posterior tibial tendinitis, unspecified leg: Secondary | ICD-10-CM

## 2016-11-15 NOTE — Patient Instructions (Signed)
Posterior Tibialis Tendinosis Posterior tibialis tendinosis is irritation and degeneration of a tendon called the posterior tibial tendon. Your posterior tibial tendon is a cord-like tissue that connects bones of your lower leg and foot to a muscle that:  Supports your arch.  Helps you raise up on your toes.  Helps you turn your foot down and in. This condition causes foot and ankle pain and can lead to a flat foot. What are the causes? This condition is most often caused by repeated stress to the tendon (overuse injury). It can also be caused by a sudden injury that stresses the tendon, such as landing on your foot after jumping or falling. What increases the risk? This condition is more likely to develop in:  People who play a sport that involves putting a lot of pressure on the feet, such as:  Basketball.  Tennis.  Soccer.  Hockey.  Runners.  Females who are older than 40 years and are overweight.  People with diabetes.  People with decreased foot stability (ligamentous laxity).  People with flat feet. What are the signs or symptoms? Symptoms of this condition may start suddenly or gradually. Symptoms include:  Pain in the inner ankle.  Pain at the arch of your foot.  Pain that gets worse with running, walking, or standing.  Swelling on the inside of your ankle and foot.  Weakness in your ankle or foot.  Inability to stand up on tiptoe. How is this diagnosed? This condition may be diagnosed based on:  Your symptoms.  Your medical history.  A physical exam.  Tests, such as:  An X-ray.  MRI.  An ultrasound. During the physical exam, your health care provider may move your foot and ankle, test your strength and balance, and check the arch of your foot while you stand or walk. How is this treated? This condition may be treated by:  Replacing high-impact exercise with low-impact exercise, such as swimming or cycling.  Applying ice to the injured  area.  Taking an anti-inflammatory pain medicine.  Physical therapy.  Wearing a special shoe or shoe insert to support your arch (orthotic). If your symptoms do not improve with these treatments, you may need to wear a splint, removable walking boot, or short leg cast for 6-8 weeks to keep your foot and ankle still. Follow these instructions at home: If you have a boot or splint:   Wear the boot or splint as told by your health care provider. Remove it only as told by your health care provider.  Do not use your foot to support (bear) your full body weight until your health care provider says that you can.  Loosen the boot or splint if your toes tingle, become numb, or turn cold and blue.  Keep the boot or splint clean.  If your boot or splint is not waterproof:  Do not let it get wet.  Cover it with a watertight plastic bag when you take a bath or shower. If you have a cast:   Do not stick anything inside the cast to scratch your skin. Doing that increases your risk of infection.  Check the skin around the cast every day. Tell your health care provider about any concerns.  You may put lotion on dry skin around the edges of the cast. Do not apply lotion to the skin underneath the cast.  Keep the cast clean.  Do not take baths, swim, or use a hot tub until your health care provider approves. Ask   your health care provider if you can take showers. You may only be allowed to take sponge baths for bathing.  If your cast is not waterproof:  Do not let it get wet.  Cover it with a watertight plastic bag while you take a bath or a shower. Managing pain and swelling   Take over-the-counter and prescription medicines only as told by your health care provider.  If directed, apply ice to the injured area:  Put ice in a plastic bag.  Place a towel between your skin and the bag.  Leave the ice on for 20 minutes, 2-3 times a day.  Raise (elevate) your ankle above the level of  your heart when resting if you have swelling. Activity   Do not do activities that make pain or swelling worse.  Return to full activity gradually as symptoms improve.  Do exercises as told by your health care provider. General instructions   If you have an orthotic, use it as told by your health care provider.  Keep all follow-up visits as told by your health care provider. This is important. How is this prevented?  Wear footwear that is appropriate to your athletic activity.  Avoid athletic activities that cause pain or swelling in your ankle or foot.  Before being active, do range-of-motion and stretching exercises.  If you develop pain or swelling while training, stop training.  If you have pain or swelling that does not improve after a few days of rest, see your health care provider.  If you start a new athletic activity, start gradually so you can build up your strength and flexibility. Contact a health care provider if:  Your symptoms get worse.  Your symptoms do not improve in 6-8 weeks.  You develop new, unexplained symptoms.  Your splint, boot, or cast gets damaged. This information is not intended to replace advice given to you by your health care provider. Make sure you discuss any questions you have with your health care provider. Document Released: 08/13/2005 Document Revised: 04/17/2016 Document Reviewed: 04/29/2015 Elsevier Interactive Patient Education  2017 [employer]  

## 2016-11-15 NOTE — Progress Notes (Signed)
Patient presents today for MRI results of her left foot.  She states she can ambulate relatively pain free in the boot but without the boot there is discomfort.  Also relates her foot turns outward when not in the boot.    O:  Neurovascular status unchanged.  Large navicular tuberosity noted left with discomfort at its insertion site.  No swelling or brusing noted.   MRI IMPRESSION: 1. Distal tibialis posterior tendinopathy, correlate clinically in assessing for tibialis posterior formed dysfunction. Mild tenosynovitis of the medial flexor tendons. 2. Borderline thickening of the medial band plantar fascia, could be an indicator of mild plantar fasciitis. 3. Suspected pes planus. 4. Talonavicular arthropathy with dorsal spurring and a dorsal-lateral effusion. 5. Degenerative arthropathy at the articulation of the navicular and medial cuneiform. Mild degenerative findings along the Lisfranc joint.  A:  Posterior tibial tendon dysfunction,  tendinopathy  P:  Discussed MRI findings with the patient.  She is very active and played tennis and pickleball.  Recommended speaking with Dr. Ardelle AntonWagoner about surgery to get her back to her normal state of activity.  She will follow up with Dr. Ardelle AntonWagoner for a pre operative consult.

## 2016-11-23 ENCOUNTER — Ambulatory Visit (INDEPENDENT_AMBULATORY_CARE_PROVIDER_SITE_OTHER): Payer: Medicare Other | Admitting: Podiatry

## 2016-11-23 DIAGNOSIS — M6790 Unspecified disorder of synovium and tendon, unspecified site: Secondary | ICD-10-CM | POA: Diagnosis not present

## 2016-11-23 DIAGNOSIS — M2140 Flat foot [pes planus] (acquired), unspecified foot: Secondary | ICD-10-CM

## 2016-11-23 DIAGNOSIS — M67969 Unspecified disorder of synovium and tendon, unspecified lower leg: Secondary | ICD-10-CM

## 2016-11-23 DIAGNOSIS — M19079 Primary osteoarthritis, unspecified ankle and foot: Secondary | ICD-10-CM | POA: Diagnosis not present

## 2016-11-23 DIAGNOSIS — M76829 Posterior tibial tendinitis, unspecified leg: Secondary | ICD-10-CM

## 2016-11-23 MED ORDER — MELOXICAM 15 MG PO TABS: 15.0000 mg | ORAL_TABLET | Freq: Every day | ORAL | 2 refills | Status: DC

## 2016-11-23 NOTE — Patient Instructions (Signed)
Posterior Tibial Tendon Tear Rehab Ask your health care provider which exercises are safe for you. Do exercises exactly as told by your health care provider and adjust them as directed. It is normal to feel mild stretching, pulling, tightness, or discomfort as you do these exercises, but you should stop right away if you feel sudden pain or your pain gets worse.Do not begin these exercises until told by your health care provider. Stretching and range of motion exercises These exercises warm up your muscles and joints and improve the movement and flexibility of your ankle. These exercises also help to relieve pain, numbness, and tingling. Exercise A: Gastroc and soleus stretch   1. Sit on the floor with your left / right leg extended. 2. Loop a belt or towel around ball of your left / right foot. The ball of your foot is on the walking surface, right under your toes. 3. Keep your left / right ankle and foot relaxed and keep your knee straight while you use the belt or towel to pull your foot and ankle toward you. You should feel a gentle stretch behind your calf or knee. 4. Hold this position for __________ seconds. Repeat __________ times. Complete this exercise __________ times a day. Exercise B: Dorsiflexion/plantar flexion   1. Sit with your left / right knee straight or bent. 2. Flex your left / right ankle to tilt the top of your foot toward your shin. 3. Hold this position for __________ seconds. 4. Point your toes downward to tilt the top of your foot away from your shin. 5. Hold this position for __________ seconds. Repeat __________ times with your knee straight and __________ times with your knee bent. Complete this exercise __________ times a day. Exercise C: Ankle plantar flexion, passive   1. Sit with your left / right leg crossed over your opposite knee. 2. Use your opposite hand to pull the top of your foot and toes toward you. You should feel a gentle stretch on the top of your  foot and ankle. 3. Hold this position for __________ seconds. Repeat __________ times. Complete this exercise __________ times a day. Exercise D: Ankle eversion   1. Sit with your left / right ankle crossed over your opposite knee. 2. Grip your left / right foot with your opposite hand, with your thumb on the top of your foot and with your fingers on the bottom of your foot. 3. Gently push your foot downward with a slight rotation so the smallest toes rise slightly toward the ceiling. You should feel a gentle stretch on the inside of your ankle. 4. Hold this stretch for __________ seconds. Repeat __________ times. Complete this exercise __________ times a day. Exercise E: Ankle inversion   1. Sit with your left / right ankle crossed over your opposite knee. 2. Hold your left / right foot with your opposite hand, with your thumb on the bottom of your foot and your fingers on the top of your foot. 3. Gently pull your foot. Your smallest toe should come toward you, and your thumb should be pushing against the ball of your foot. You should feel a gentle stretch on the outside of your ankle. 4. Hold the stretch for __________ seconds. Repeat __________ times. Complete this exercise __________ times a day. Exercise F: Ankle alphabet   1. Sit with your left / right leg supported at the lower leg.  Do not rest your foot on anything.  Make sure your foot has room to  move freely. 2. Think of your left / right foot as a paintbrush, and move your foot to trace each letter of the alphabet in the air. Keep your hip and knee still while you trace. 3. Trace every letter from A to Z. Repeat __________ times. Complete this exercise __________ times a day. Strengthening exercises These exercises build strength and endurance in your lower leg. Endurance is the ability to use your muscles for a long time, even after they get tired. Exercise G: Dorsiflexors   1. Secure a rubber exercise band or tube to an  object that will not move if it is pulled on, such as a table leg. 2. Secure the other end of the band around your left / right foot. 3. Sit on the floor, facing the object with your left / right leg extended. The band or tube should be slightly tense when your foot is relaxed. 4. Slowly flex your left / right ankle and toes to bring your foot toward you. 5. Hold this position for __________ seconds. 6. Let the band or tube slowly pull your foot back to the starting position. Repeat __________ times. Complete this exercise __________ times a day. Exercise H: Plantar flexors   1. Sit on the floor with your left / right leg extended. 2. Loop a rubber exercise band or tube around the ball of your __________ foot. The ball of your foot is on the walking surface, right under your toes. The band or tube should be slightly tense when your foot is relaxed. 3. Slowly point your toes downward, pushing them away from you. 4. Hold this position for __________ seconds. 5. Let the band or tube slowly pull your foot back to the starting position. Repeat __________ times. Complete this exercise __________ times a day. Exercise I: Towel curls   1. Sit in a chair on a non-carpeted surface, and put your feet on the floor. 2. Place a towel in front of your feet. If told by your health care provider, add __________ to the end of the towel. 3. Keeping your heel on the floor, put your left / right foot on the towel. 4. Pull the towel toward you by grabbing the towel with your toes and curling them under. Keep your heel on the floor. Repeat __________ times. Complete this exercise __________ times a day. This information is not intended to replace advice given to you by your health care provider. Make sure you discuss any questions you have with your health care provider. Document Released: 08/13/2005 Document Revised: 04/19/2016 Document Reviewed: 08/07/2015 Elsevier Interactive Patient Education  2017 [employer].

## 2016-11-23 NOTE — Progress Notes (Signed)
Subjective: 71 year old female presents the also hasn't for concerns and surgical consideration for her left ankle pain. She states that she's had pain to the inside aspect of her ankle for a couple months this time. She has previously been in the cam boot for mobilization as well as an over-the-counter insert which of both helped but she still continues to get pain to the area. She states that she's had flatfoot her entire life that she's never had pain until now. She's had no other treatment at this time. Denies any systemic complaints such as fevers, chills, nausea, vomiting. No acute changes since last appointment, and no other complaints at this time.   Objective: AAO x3, NAD DP/PT pulses palpable bilaterally, CRT less than 3 seconds There is a significant decrease in medial arch height upon weightbearing on left lower chemise. There is tenderness along the posterior medial ankle just posterior to the medial malleolus and the course of the flexor/posterior tibial tendon. There is no pain on the insertion of the posterior tibial tendon into the navicular tuberosity. She is able to perform a single and double heel rise without any problems. There is no edema to the foot and is no erythema or increase in warmth. Equinus present. There is no pain in the contralateral extremity. No open lesions or pre-ulcerative lesions.  No pain with calf compression, swelling, warmth, erythema  MRI 11/11/16 IMPRESSION: 1. Distal tibialis posterior tendinopathy, correlate clinically in assessing for tibialis posterior formed dysfunction. Mild tenosynovitis of the medial flexor tendons. 2. Borderline thickening of the medial band plantar fascia, could be an indicator of mild plantar fasciitis. 3. Suspected pes planus. 4. Talonavicular arthropathy with dorsal spurring and a dorsal-lateral effusion. 5. Degenerative arthropathy at the articulation of the navicular and medial cuneiform. Mild degenerative findings  along the Lisfranc joint.   Assessment: Posterior tibial tendinitis due to flatfoot, after arthritis  Plan: -All treatment options discussed with the patient including all alternatives, risks, complications.  -Review the patient's chart MRI to discuss the findings with the patient. We also reviewed her x-rays together. At this time I recommended her to start with a custom molded orthotic as well as rehabilitation exercises the posterior tibial tendon. Also discussed the possible bracing but we would start with orthotics first. I also discussed surgical intervention however are not convinced that debriding the posterior tibial tendon is going to give her long-term relief given her foot type. We will start the orthotics, rehabilitation. She wished on formal physical therapy today. -She was measured for orthotics and they're sent to [facility] labs. -Patient encouraged to call the office with any questions, concerns, change in symptoms.   Ovid Curd, DPM

## 2016-12-14 ENCOUNTER — Encounter: Payer: Self-pay | Admitting: Podiatry

## 2016-12-14 ENCOUNTER — Ambulatory Visit (INDEPENDENT_AMBULATORY_CARE_PROVIDER_SITE_OTHER): Payer: Medicare Other | Admitting: Podiatry

## 2016-12-14 DIAGNOSIS — M2140 Flat foot [pes planus] (acquired), unspecified foot: Secondary | ICD-10-CM

## 2016-12-14 DIAGNOSIS — M76829 Posterior tibial tendinitis, unspecified leg: Secondary | ICD-10-CM

## 2016-12-14 DIAGNOSIS — M6790 Unspecified disorder of synovium and tendon, unspecified site: Secondary | ICD-10-CM

## 2016-12-14 MED ORDER — DICLOFENAC SODIUM 75 MG PO TBEC: 75.0000 mg | DELAYED_RELEASE_TABLET | Freq: Two times a day (BID) | ORAL | 2 refills | Status: DC

## 2016-12-14 NOTE — Patient Instructions (Signed)

## 2016-12-17 NOTE — Progress Notes (Signed)
Subjective: 71 year old female presents the office they for further pickup orthotics. She states that the ankle brace helped quite a bit of the first ray but has recently not been working as much. She has no acute changes last point and she has no other concerns today. Denies any systemic complaints such as fevers, chills, nausea, vomiting. No acute changes since last appointment, and no other complaints at this time.   Objective: AAO x3, NAD DP/PT pulses palpable bilaterally, CRT less than 3 seconds Overall exam is unchanged. There is continued tenderness palpation of the posterior medial aspect of the ankle just posterior to the medial malleolus and along the course of flexor/posterior tibial tendon. No pain on the insertion into the navicular tuberosity. There is no area pinpoint bony tissue pain vibratory sensation. There is mild edema to the area but is no erythema or increase in warmth.  No open lesions or pre-ulcerative lesions.  No pain with calf compression, swelling, warmth, erythema  Assessment: Posterior tibial tendinitis due to flatfoot, osteoarthritis  Plan: -All treatment options discussed with the patient including all alternatives, risks, complications.  -Orthotics were dispensed today. Oral and written break in instructions were discussed. -Continue ankle brace as well. Long-term she may need to Richie type ankle brace. -Continue supportive shoe gear as well. -Follow-up 4 weeks or sooner if needed. -Patient encouraged to call the office with any questions, concerns, change in symptoms.   Ovid Curd, DPM

## 2016-12-28 ENCOUNTER — Encounter: Payer: Self-pay | Admitting: Podiatry

## 2016-12-28 ENCOUNTER — Ambulatory Visit (INDEPENDENT_AMBULATORY_CARE_PROVIDER_SITE_OTHER): Payer: Medicare Other | Admitting: Podiatry

## 2016-12-28 DIAGNOSIS — M6790 Unspecified disorder of synovium and tendon, unspecified site: Secondary | ICD-10-CM | POA: Diagnosis not present

## 2016-12-28 DIAGNOSIS — M19079 Primary osteoarthritis, unspecified ankle and foot: Secondary | ICD-10-CM

## 2016-12-28 DIAGNOSIS — M76829 Posterior tibial tendinitis, unspecified leg: Secondary | ICD-10-CM

## 2016-12-28 DIAGNOSIS — M2140 Flat foot [pes planus] (acquired), unspecified foot: Secondary | ICD-10-CM | POA: Diagnosis not present

## 2016-12-28 NOTE — Patient Instructions (Signed)
Achilles Tendon Rupture, Phase I Rehab Ask your health care provider which exercises are safe for you. Do exercises exactly as told by your health care provider and adjust them as directed. It is normal to feel mild stretching, pulling, tightness, or discomfort as you do these exercises, but you should stop right away if you feel sudden pain or your pain gets worse.Do not begin these exercises until told by your health care provider. Stretching and range of motion exercises These exercises warm up your muscles and joints and improve the movement and flexibility of your lower leg and heel. These exercises also help to relieve pain, numbness, and tingling. Exercise A: Dorsiflexion and plantar flexion, active range of motion   1. Sit with your left / right knee straight or bent. Do not rest your foot on anything. 2. Flex your left / right ankle to tilt the top of your foot toward your shin. Stop at the first point of resistance or at the angle where your health care provider told you to stop. 3. Hold this position for __________ seconds. 4. Point your toes downward to tilt the top of your foot away from your shin. 5. Hold this position for __________ seconds. Repeat __________ times. Complete this exercise __________ times a day. Exercise B: Ankle alphabet   1. Sit with your left / right leg supported at the lower leg.  Do not rest your foot on anything.  Make sure your foot has room to move freely. 2. Think of your left / right foot as a paintbrush, and move your foot to trace each letter of the alphabet in the air. Keep your hip and knee still while you trace. 3. Trace every letter from A to Z. Repeat __________ times. Complete this exercise __________ times a day. Strengthening exercises These exercises build strength and endurance in your lower leg. Endurance is the ability to use your muscles for a long time, even after they get tired. Exercise C: Dorsiflexion   1. Secure a rubber  exercise band or tube to an object, such as a table leg, that will stay still when the band is pulled. Secure the other end around your left / right foot. 2. Sit on the floor facing the object with your left / right leg extended and your toes pointing down. The band or tube should be slightly tense when your foot is relaxed. 3. Slowly flex your left / right ankle and toes to bring your foot toward you. Stop when you feel resistance in your Achilles tendon or stop at the angle where your health care provider told you to stop. 4. Hold this position for __________ seconds. 5. Slowly return your foot to the starting position. Repeat __________ times. Complete this exercise __________ times a day. Exercise D: Eversion  1. Sit on the floor with your legs straight out in front of you. 2. Loop a rubber exercise band around your left / right foot, across the top part of the walking surface (ball) of that foot. Hold the band in your hands or secure it to a stable object. 3. Slowly push your foot outward, away from your other leg. 4. Hold this position for __________ seconds. 5. Slowly return to the starting position. Repeat __________ times. Complete this exercise __________ times a day. Exercise E: Inversion  1. Sit on the floor with your legs straight out in front of you. 2. Loop a rubber exercise band around your left / right foot, across the top part of   the walking surface (ball) of that foot. Hold the band in your hands or secure it to a stable object. 3. Slowly push your foot inward, toward your other leg. 4. Hold this position for __________ seconds. 5. Slowly return your foot to the starting position. Repeat __________ times. Complete this exercise __________ times a day. Exercise F: Plantar flexion, seated   1. Sit on a chair with your feet flat on the floor. 2. If told by your health care provider, place __________ lb. on your left / right knee. 3. Keeping your toes firmly on the floor, lift  your left / right heel as far as you can without increasing any discomfort in your ankle. 4. Bring your heel back down to the floor. Repeat __________ times. Complete this exercise __________ times a day. This information is not intended to replace advice given to you by your health care provider. Make sure you discuss any questions you have with your health care provider. Document Released: 08/13/2005 Document Revised: 04/19/2016 Document Reviewed: 07/06/2015 Elsevier Interactive Patient Education  2017 [employer]  

## 2017-01-02 NOTE — Progress Notes (Signed)
Subjective: 71 year old female presents the office they for follow-up evaluation of possible arthritis, flatfoot, posterior table tendinitis/dysfunction. She states the orthotics are making huge difference that she is able to do her daily activities. She does occasionally with ankle brace however she has noticed significant improvement since wearing the orthotics. She also states the orthotics are fitting well. Denies any systemic complaints such as fevers, chills, nausea, vomiting. No acute changes since last appointment, and no other complaints at this time.   Objective: AAO x3, NAD DP/PT pulses palpable bilaterally, CRT less than 3 seconds Overall exam is unchanged however there is no significant tenderness palpation of the posterior medial aspect of the ankle on the course of posterior tibial tendon. There is asymmetric and flatfoot deformity present. Equinus is also present. This no area pinpoint bony tenderness. No open lesions or pre-ulcerative lesions.  No pain with calf compression, swelling, warmth, erythema  Assessment: Posterior tibial tendon dysfunction/ulcer arthritis/flatfoot with improved symptoms with orthotics.  Plan: -All treatment options discussed with the patient including all alternatives, risks, complications.  -Orthotics appear to be fitting well. Continue with this also discussed supportive shoe gear. She or ankle brace as needed. -At this point her symptoms are much improved. I'll see her back as needed. -Patient encouraged to call the office with any questions, concerns, change in symptoms.   Ovid CurdMatthew Delpha [name], DPM

## 2017-04-25 ENCOUNTER — Other Ambulatory Visit: Payer: Self-pay | Admitting: Physician Assistant

## 2017-05-24 ENCOUNTER — Other Ambulatory Visit: Payer: Self-pay | Admitting: Family Medicine

## 2017-05-24 DIAGNOSIS — R5381 Other malaise: Secondary | ICD-10-CM

## 2017-05-27 DIAGNOSIS — H903 Sensorineural hearing loss, bilateral: Secondary | ICD-10-CM | POA: Insufficient documentation

## 2017-05-27 DIAGNOSIS — H608X3 Other otitis externa, bilateral: Secondary | ICD-10-CM | POA: Insufficient documentation

## 2017-05-31 ENCOUNTER — Other Ambulatory Visit: Payer: Self-pay | Admitting: Family Medicine

## 2017-05-31 DIAGNOSIS — Z1231 Encounter for screening mammogram for malignant neoplasm of breast: Secondary | ICD-10-CM

## 2017-05-31 DIAGNOSIS — E2839 Other primary ovarian failure: Secondary | ICD-10-CM

## 2017-06-28 ENCOUNTER — Ambulatory Visit
Admission: RE | Admit: 2017-06-28 | Discharge: 2017-06-28 | Disposition: A | Discharge status: Home / Self Care | Payer: Medicare Other | Source: Ambulatory Visit | Attending: Family Medicine | Admitting: Family Medicine

## 2017-06-28 DIAGNOSIS — E2839 Other primary ovarian failure: Secondary | ICD-10-CM

## 2017-06-28 DIAGNOSIS — Z1231 Encounter for screening mammogram for malignant neoplasm of breast: Secondary | ICD-10-CM

## 2017-07-15 ENCOUNTER — Other Ambulatory Visit: Payer: Self-pay | Admitting: Rehabilitation

## 2017-07-15 DIAGNOSIS — M545 Low back pain, unspecified: Secondary | ICD-10-CM

## 2017-07-23 ENCOUNTER — Ambulatory Visit
Admission: RE | Admit: 2017-07-23 | Discharge: 2017-07-23 | Disposition: A | Discharge status: Home / Self Care | Payer: Medicare Other | Source: Ambulatory Visit | Attending: Rehabilitation | Admitting: Rehabilitation

## 2017-07-23 DIAGNOSIS — M545 Low back pain, unspecified: Secondary | ICD-10-CM

## 2017-07-23 MED ORDER — GADOBENATE DIMEGLUMINE 529 MG/ML IV SOLN
10.0000 mL | Freq: Once | INTRAVENOUS | Status: AC | PRN
  Administered 2017-07-23: 10 mL via INTRAVENOUS

## 2017-07-27 ENCOUNTER — Other Ambulatory Visit: Payer: Medicare Other

## 2017-10-20 ENCOUNTER — Ambulatory Visit (HOSPITAL_COMMUNITY)
Admission: RE | Admit: 2017-10-20 | Discharge: 2017-10-20 | Disposition: A | Discharge status: Home / Self Care | Payer: Medicare Other | Source: Ambulatory Visit | Attending: Physician Assistant | Admitting: Physician Assistant

## 2017-10-20 ENCOUNTER — Other Ambulatory Visit: Payer: Self-pay | Admitting: Physician Assistant

## 2017-10-20 DIAGNOSIS — M79662 Pain in left lower leg: Secondary | ICD-10-CM | POA: Diagnosis not present

## 2017-10-20 NOTE — Progress Notes (Signed)
VASCULAR LAB PRELIMINARY  PRELIMINARY  PRELIMINARY  PRELIMINARY  Left lower extremity venous duplex completed.    Preliminary report:  There is no DVT or SVT noted in the left lower extremity. [place]  [place], Verdelle Valtierra, RVT 10/20/2017, 3:53 PM

## 2018-03-31 NOTE — Progress Notes (Signed)
Cardiology Office Note   Date:  04/01/2018   ID:  Kimberly [name], DOB 08/14/1946, MRN [id number]  PCP:  Catha Gosselin, MD  Cardiologist:   Charlton Haws, MD   No chief complaint on file.     History of Present Illness: Kimberly [name] is a 72 y.o. female who presents for consultation regarding preoperative clearance and an abnormal ECG Referred by Juluis Rainier  She has a history of HTN She needs lumbar spine fusion with Dr Sharolyn Douglas Surgery scheduled for 04/08/18 ECG reported as showing flipped T waves anteriorly To my review 03/27/18 SR rate 64 poor R wave progression and nonspecific ST changes LAD I compared it to ECG from 05/21/16 and there are no significant changes  Lab review normal Hct/PLT Cr .77 K 4.2   TTE done 05/21/16 EF 60-65% no LVH no valve disease LE venous duplex for calf pain on left negative 10/20/17    Had an episode of epigastric substernal pain a month ago lasting about an hour Thought to be reflux and Rx Proton pump She limits soup intake and uses A pillow wedge now with no further pains   No exertional pain, bleeding diathesis or previous anesthesia issues This will be her 3rd back surgery and least invasive with Dr Noel Gerold    Past Medical History:  Diagnosis Date  . Arthritis 07/06/11   right thumb  . Hypertension     Past Surgical History:  Procedure Laterality Date  . ABDOMINAL HYSTERECTOMY  approx. 2008  . BACK SURGERY  2013   lumb fusion  . BACK SURGERY  10/2015  . BREAST EXCISIONAL BIOPSY Left    benign  . BREAST LUMPECTOMY  appro. 1990's   left - benign  . COLONOSCOPY    . KNEE ARTHROSCOPY     both scopes  . SHOULDER ARTHROSCOPY  5/15   right  . SHOULDER ARTHROSCOPY WITH ROTATOR CUFF REPAIR AND SUBACROMIAL DECOMPRESSION Right 07/30/2014   Procedure: RIGHT SHOULDER ARTHROSCOPY WITH REVISION ACROMIOPLASTY, DEBRIDEMENT AND REVISION OPEN ROTATOR CUFF REPAIR   ;  Surgeon: Thera Flake., MD;  Location: [facility];   Service: Orthopedics;  Laterality: Right;  . TONSILLECTOMY       Current Outpatient Medications  Medication Sig Dispense Refill  . albuterol (PROAIR HFA) 108 (90 Base) MCG/ACT inhaler Inhale into the lungs as directed.    Marland Kitchen aspirin EC 81 MG tablet Take 1 tablet (81 mg total) by mouth daily. 30 tablet 0  . atorvastatin (LIPITOR) 10 MG tablet Take 10 mg by mouth daily.    . cholecalciferol (VITAMIN D) 1000 UNITS tablet Take 1,000 Units by mouth daily.      . diclofenac (VOLTAREN) 75 MG EC tablet Take 1 tablet (75 mg total) by mouth 2 (two) times daily. 30 tablet 2  . metoprolol tartrate (LOPRESSOR) 25 MG tablet Take 1 tablet (25 mg total) by mouth 2 (two) times daily. 62 tablet 0  . Multiple Vitamin (MULTIVITAMIN) tablet Take 1 tablet by mouth daily.      . nitroGLYCERIN (NITROSTAT) 0.4 MG SL tablet Place 1 tablet (0.4 mg total) under the tongue every 5 (five) minutes as needed for chest pain. 30 tablet 0  . pantoprazole (PROTONIX) 40 MG tablet Take 40 mg by mouth as directed.    . ranitidine (ZANTAC) 150 MG tablet Take 1 tablet (150 mg total) by mouth 2 (two) times daily. 28 tablet 0  . sertraline (ZOLOFT) 50 MG tablet Take 50  mg by mouth daily.       No current facility-administered medications for this visit.     Allergies:   Flexeril [cyclobenzaprine hcl]; Penicillins; Skelaxin; Cyclobenzaprine; and Metaxalone    Social History:  The patient  reports that she quit smoking about 26 years ago. Her smoking use included cigarettes. She quit after 15.00 years of use. She has never used smokeless tobacco. She reports that she drinks alcohol. She reports that she does not use drugs.   Family History:  The patient's family history includes Cancer in her brother; Coronary artery disease in her father.    ROS:  Please see the history of present illness.   Otherwise, review of systems are positive for none.   All other systems are reviewed and negative.    PHYSICAL EXAM: VS:  BP (!) 166/76    Pulse 67   Ht 5\' 2"  (1.575 m)   Wt 120 lb 12 oz (54.8 kg)   SpO2 96%   BMI 22.09 kg/m  , BMI Body mass index is 22.09 kg/m. Affect appropriate Healthy:  appears stated age HEENT: normal Neck supple with no adenopathy JVP normal no bruits no thyromegaly Lungs clear with no wheezing and good diaphragmatic motion Heart:  S1/S2 no murmur, no rub, gallop or click PMI normal Abdomen: benighn, BS positve, no tenderness, no AAA no bruit.  No HSM or HJR Distal pulses intact with no bruits No edema Neuro non-focal Skin warm and dry No muscular weakness    EKG:  See HPI  SR rate 60 normal Axis -29    Recent Labs: No results found for requested labs within last 8760 hours.    Lipid Panel    Component Value Date/Time   CHOL 277 (H) 05/21/2016 0302   TRIG 113 05/21/2016 0302   HDL 77 05/21/2016 0302   CHOLHDL 3.6 05/21/2016 0302   VLDL 23 05/21/2016 0302   LDLCALC 177 (H) 05/21/2016 0302      Wt Readings from Last 3 Encounters:  04/01/18 120 lb 12 oz (54.8 kg)  05/21/16 117 lb (53.1 kg)  07/30/14 116 lb (52.6 kg)      Other studies Reviewed: Additional studies/ records that were reviewed today include: Notes from spine specialist and primary labs and notes TTE 2017 LE venous duplex 2019.    ASSESSMENT AND PLAN:  1.  Preoperative :  ECG is actually normal She has S waves in V12 and her T waves should Be flipped in V1 and V2.  No previous CAD No chest pain other than a month ago which sounded like reflux and responded to dietary measures and sleeping wedge Clear to have Surgery next week with no further testing  2. HTN:  Well controlled.  Continue current medications and low sodium Dash type diet.   3. HLD continue statin labs with primary 4. GERD: continue proton pump inhibitor low carb diet 5. Depression : on Zoloft mood seems appropriate    Current medicines are reviewed at length with the patient today.  The patient does not have concerns regarding  medicines.  The following changes have been made:  None   Labs/ tests ordered today include: None   Orders Placed This Encounter  Procedures  . EKG 12-Lead     Disposition:   FU with cardiology PRN      Signed, Charlton HawsPeter Claxton Levitz, MD  04/01/2018 4:41 PM    [facility] [address], [place], [place]  [address] Phone: [phone]; Fax: [phone]  

## 2018-04-01 ENCOUNTER — Encounter: Payer: Self-pay | Admitting: Cardiovascular Disease

## 2018-04-01 ENCOUNTER — Ambulatory Visit: Payer: Medicare Other | Admitting: Cardiovascular Disease

## 2018-04-01 VITALS — BP 166/76 | HR 67 | Ht 62.0 in | Wt 120.8 lb

## 2018-04-01 DIAGNOSIS — M5136 Other intervertebral disc degeneration, lumbar region: Secondary | ICD-10-CM | POA: Insufficient documentation

## 2018-04-01 DIAGNOSIS — I10 Essential (primary) hypertension: Secondary | ICD-10-CM | POA: Diagnosis not present

## 2018-04-01 DIAGNOSIS — M48061 Spinal stenosis, lumbar region without neurogenic claudication: Secondary | ICD-10-CM | POA: Insufficient documentation

## 2018-04-01 DIAGNOSIS — M51369 Other intervertebral disc degeneration, lumbar region without mention of lumbar back pain or lower extremity pain: Secondary | ICD-10-CM | POA: Insufficient documentation

## 2018-04-01 DIAGNOSIS — R9431 Abnormal electrocardiogram [ECG] [EKG]: Secondary | ICD-10-CM

## 2018-04-01 DIAGNOSIS — E785 Hyperlipidemia, unspecified: Secondary | ICD-10-CM | POA: Diagnosis not present

## 2018-04-01 DIAGNOSIS — M545 Low back pain, unspecified: Secondary | ICD-10-CM | POA: Insufficient documentation

## 2018-04-01 DIAGNOSIS — Z01818 Encounter for other preprocedural examination: Secondary | ICD-10-CM

## 2018-04-01 DIAGNOSIS — M5416 Radiculopathy, lumbar region: Secondary | ICD-10-CM | POA: Insufficient documentation

## 2018-04-01 DIAGNOSIS — M47816 Spondylosis without myelopathy or radiculopathy, lumbar region: Secondary | ICD-10-CM | POA: Insufficient documentation

## 2018-04-01 NOTE — Patient Instructions (Addendum)
Medication Instructions:  Your physician recommends that you continue on your current medications as directed. Please refer to the Current Medication list given to you today.  Labwork: NONE  Testing/Procedures: NONE  Follow-Up: Your physician wants you to follow-up as needed with  Dr. Nishan.    If you need a refill on your cardiac medications before your next appointment, please call your pharmacy.    

## 2018-06-03 ENCOUNTER — Other Ambulatory Visit: Payer: Self-pay | Admitting: Family Medicine

## 2018-06-03 DIAGNOSIS — Z1231 Encounter for screening mammogram for malignant neoplasm of breast: Secondary | ICD-10-CM

## 2018-07-11 ENCOUNTER — Ambulatory Visit
Admission: RE | Admit: 2018-07-11 | Discharge: 2018-07-11 | Disposition: A | Discharge status: Home / Self Care | Payer: Medicare Other | Source: Ambulatory Visit | Attending: Family Medicine | Admitting: Family Medicine

## 2018-07-11 DIAGNOSIS — Z1231 Encounter for screening mammogram for malignant neoplasm of breast: Secondary | ICD-10-CM

## 2018-09-01 DIAGNOSIS — M65341 Trigger finger, right ring finger: Secondary | ICD-10-CM | POA: Insufficient documentation

## 2018-10-15 ENCOUNTER — Other Ambulatory Visit: Payer: Self-pay | Admitting: Orthopaedic Surgery

## 2018-10-15 DIAGNOSIS — M542 Cervicalgia: Secondary | ICD-10-CM

## 2018-10-29 ENCOUNTER — Ambulatory Visit
Admission: RE | Admit: 2018-10-29 | Discharge: 2018-10-29 | Disposition: A | Discharge status: Home / Self Care | Payer: Medicare Other | Source: Ambulatory Visit | Attending: Orthopaedic Surgery | Admitting: Orthopaedic Surgery

## 2018-10-29 DIAGNOSIS — M542 Cervicalgia: Secondary | ICD-10-CM

## 2019-01-01 ENCOUNTER — Other Ambulatory Visit: Payer: Self-pay | Admitting: Orthopedic Surgery

## 2019-01-02 ENCOUNTER — Encounter (HOSPITAL_BASED_OUTPATIENT_CLINIC_OR_DEPARTMENT_OTHER): Payer: Self-pay | Admitting: *Deleted

## 2019-01-02 ENCOUNTER — Other Ambulatory Visit: Payer: Self-pay

## 2019-01-02 ENCOUNTER — Other Ambulatory Visit: Payer: Self-pay | Admitting: Orthopedic Surgery

## 2019-01-05 ENCOUNTER — Other Ambulatory Visit (HOSPITAL_COMMUNITY): Payer: Medicare Other

## 2019-01-06 ENCOUNTER — Other Ambulatory Visit (HOSPITAL_COMMUNITY)
Admission: RE | Admit: 2019-01-06 | Discharge: 2019-01-06 | Disposition: A | Discharge status: Home / Self Care | Payer: Medicare Other | Source: Ambulatory Visit | Attending: Orthopedic Surgery | Admitting: Orthopedic Surgery

## 2019-01-06 ENCOUNTER — Other Ambulatory Visit: Payer: Self-pay

## 2019-01-06 DIAGNOSIS — Z1159 Encounter for screening for other viral diseases: Secondary | ICD-10-CM | POA: Insufficient documentation

## 2019-01-07 LAB — NOVEL CORONAVIRUS, NAA (HOSP ORDER, SEND-OUT TO REF LAB; TAT 18-24 HRS): SARS-CoV-2, NAA: NOT DETECTED

## 2019-01-08 ENCOUNTER — Ambulatory Visit (HOSPITAL_BASED_OUTPATIENT_CLINIC_OR_DEPARTMENT_OTHER): Payer: Medicare Other | Admitting: Anesthesiology

## 2019-01-08 ENCOUNTER — Other Ambulatory Visit: Payer: Self-pay

## 2019-01-08 ENCOUNTER — Encounter (HOSPITAL_BASED_OUTPATIENT_CLINIC_OR_DEPARTMENT_OTHER)
Admission: RE | Disposition: A | Discharge status: Home / Self Care | Payer: Self-pay | Source: Home / Self Care | Attending: Orthopedic Surgery

## 2019-01-08 ENCOUNTER — Ambulatory Visit (HOSPITAL_BASED_OUTPATIENT_CLINIC_OR_DEPARTMENT_OTHER)
Admission: RE | Admit: 2019-01-08 | Discharge: 2019-01-08 | Disposition: A | Discharge status: Home / Self Care | Payer: Medicare Other | Attending: Orthopedic Surgery | Admitting: Orthopedic Surgery

## 2019-01-08 ENCOUNTER — Encounter (HOSPITAL_BASED_OUTPATIENT_CLINIC_OR_DEPARTMENT_OTHER): Payer: Self-pay | Admitting: *Deleted

## 2019-01-08 DIAGNOSIS — Z79899 Other long term (current) drug therapy: Secondary | ICD-10-CM | POA: Diagnosis not present

## 2019-01-08 DIAGNOSIS — K219 Gastro-esophageal reflux disease without esophagitis: Secondary | ICD-10-CM | POA: Diagnosis not present

## 2019-01-08 DIAGNOSIS — F419 Anxiety disorder, unspecified: Secondary | ICD-10-CM | POA: Insufficient documentation

## 2019-01-08 DIAGNOSIS — M19041 Primary osteoarthritis, right hand: Secondary | ICD-10-CM | POA: Insufficient documentation

## 2019-01-08 DIAGNOSIS — I10 Essential (primary) hypertension: Secondary | ICD-10-CM | POA: Insufficient documentation

## 2019-01-08 DIAGNOSIS — Z87891 Personal history of nicotine dependence: Secondary | ICD-10-CM | POA: Insufficient documentation

## 2019-01-08 DIAGNOSIS — F329 Major depressive disorder, single episode, unspecified: Secondary | ICD-10-CM | POA: Insufficient documentation

## 2019-01-08 DIAGNOSIS — M65841 Other synovitis and tenosynovitis, right hand: Secondary | ICD-10-CM | POA: Diagnosis not present

## 2019-01-08 HISTORY — DX: Depression, unspecified: F32.A

## 2019-01-08 HISTORY — PX: TRIGGER FINGER RELEASE: SHX641

## 2019-01-08 HISTORY — DX: Major depressive disorder, single episode, unspecified: F32.9

## 2019-01-08 HISTORY — DX: Anxiety disorder, unspecified: F41.9

## 2019-01-08 HISTORY — DX: Gastro-esophageal reflux disease without esophagitis: K21.9

## 2019-01-08 HISTORY — DX: Trigger finger, unspecified finger: M65.30

## 2019-01-08 SURGERY — RELEASE, A1 PULLEY, FOR TRIGGER FINGER
Anesthesia: Regional | Site: Hand | Laterality: Right

## 2019-01-08 MED ORDER — LACTATED RINGERS IV SOLN
INTRAVENOUS | Status: DC
  Administered 2019-01-08: 10 mL/h via INTRAVENOUS

## 2019-01-08 MED ORDER — BUPIVACAINE HCL (PF) 0.25 % IJ SOLN
INTRAMUSCULAR | Status: DC | PRN
  Administered 2019-01-08: 6 mL

## 2019-01-08 MED ORDER — PROPOFOL 10 MG/ML IV BOLUS
INTRAVENOUS | Status: DC | PRN
  Administered 2019-01-08: 20 mg via INTRAVENOUS

## 2019-01-08 MED ORDER — CLINDAMYCIN PHOSPHATE 900 MG/50ML IV SOLN
900.0000 mg | INTRAVENOUS | Status: AC
  Administered 2019-01-08: 900 mg via INTRAVENOUS

## 2019-01-08 MED ORDER — ONDANSETRON HCL 4 MG/2ML IJ SOLN: 4.0000 mg | Freq: Once | INTRAMUSCULAR | Status: DC | PRN

## 2019-01-08 MED ORDER — HYDROCODONE-ACETAMINOPHEN 5-325 MG PO TABS: 1.0000 | ORAL_TABLET | Freq: Four times a day (QID) | ORAL | 0 refills | Status: DC | PRN

## 2019-01-08 MED ORDER — FENTANYL CITRATE (PF) 100 MCG/2ML IJ SOLN
INTRAMUSCULAR | Status: AC
  Filled 2019-01-08: qty 2

## 2019-01-08 MED ORDER — MIDAZOLAM HCL 2 MG/2ML IJ SOLN
1.0000 mg | INTRAMUSCULAR | Status: DC | PRN
  Administered 2019-01-08: 09:00:00 1 mg via INTRAVENOUS

## 2019-01-08 MED ORDER — OXYCODONE HCL 5 MG/5ML PO SOLN: 5.0000 mg | Freq: Once | ORAL | Status: DC | PRN

## 2019-01-08 MED ORDER — FENTANYL CITRATE (PF) 100 MCG/2ML IJ SOLN: 25.0000 ug | INTRAMUSCULAR | Status: DC | PRN

## 2019-01-08 MED ORDER — ONDANSETRON HCL 4 MG/2ML IJ SOLN
INTRAMUSCULAR | Status: AC
  Filled 2019-01-08: qty 2

## 2019-01-08 MED ORDER — EPHEDRINE 5 MG/ML INJ
INTRAVENOUS | Status: AC
  Filled 2019-01-08: qty 10

## 2019-01-08 MED ORDER — MIDAZOLAM HCL 2 MG/2ML IJ SOLN
INTRAMUSCULAR | Status: AC
  Filled 2019-01-08: qty 2

## 2019-01-08 MED ORDER — DEXAMETHASONE SODIUM PHOSPHATE 10 MG/ML IJ SOLN
INTRAMUSCULAR | Status: AC
  Filled 2019-01-08: qty 1

## 2019-01-08 MED ORDER — LIDOCAINE 2% (20 MG/ML) 5 ML SYRINGE
INTRAMUSCULAR | Status: AC
  Filled 2019-01-08: qty 5

## 2019-01-08 MED ORDER — LIDOCAINE HCL (PF) 0.5 % IJ SOLN
INTRAMUSCULAR | Status: DC | PRN
  Administered 2019-01-08: 30 mL via INTRAVENOUS

## 2019-01-08 MED ORDER — CLINDAMYCIN PHOSPHATE 900 MG/50ML IV SOLN
INTRAVENOUS | Status: AC
  Filled 2019-01-08: qty 50

## 2019-01-08 MED ORDER — CHLORHEXIDINE GLUCONATE 4 % EX LIQD: 60.0000 mL | Freq: Once | CUTANEOUS | Status: DC

## 2019-01-08 MED ORDER — SCOPOLAMINE 1 MG/3DAYS TD PT72: 1.0000 | MEDICATED_PATCH | Freq: Once | TRANSDERMAL | Status: DC | PRN

## 2019-01-08 MED ORDER — ONDANSETRON HCL 4 MG/2ML IJ SOLN
INTRAMUSCULAR | Status: DC | PRN
  Administered 2019-01-08: 4 mg via INTRAVENOUS

## 2019-01-08 MED ORDER — BUPIVACAINE HCL (PF) 0.25 % IJ SOLN
INTRAMUSCULAR | Status: AC
  Filled 2019-01-08: qty 60

## 2019-01-08 MED ORDER — SUCCINYLCHOLINE CHLORIDE 200 MG/10ML IV SOSY
PREFILLED_SYRINGE | INTRAVENOUS | Status: AC
  Filled 2019-01-08: qty 10

## 2019-01-08 MED ORDER — OXYCODONE HCL 5 MG PO TABS: 5.0000 mg | ORAL_TABLET | Freq: Once | ORAL | Status: DC | PRN

## 2019-01-08 MED ORDER — FENTANYL CITRATE (PF) 100 MCG/2ML IJ SOLN
50.0000 ug | INTRAMUSCULAR | Status: DC | PRN
  Administered 2019-01-08: 09:00:00 50 ug via INTRAVENOUS

## 2019-01-08 MED ORDER — PHENYLEPHRINE 40 MCG/ML (10ML) SYRINGE FOR IV PUSH (FOR BLOOD PRESSURE SUPPORT)
PREFILLED_SYRINGE | INTRAVENOUS | Status: AC
  Filled 2019-01-08: qty 10

## 2019-01-08 SURGICAL SUPPLY — 33 items
BLADE SURG 15 STRL LF DISP TIS (BLADE) ×1 IMPLANT
BLADE SURG 15 STRL SS (BLADE) ×1
BNDG COHESIVE 2X5 TAN STRL LF (GAUZE/BANDAGES/DRESSINGS) ×2 IMPLANT
BNDG ESMARK 4X9 LF (GAUZE/BANDAGES/DRESSINGS) IMPLANT
CHLORAPREP W/TINT 26 (MISCELLANEOUS) ×2 IMPLANT
CORD BIPOLAR FORCEPS 12FT (ELECTRODE) IMPLANT
COVER BACK TABLE REUSABLE LG (DRAPES) ×2 IMPLANT
COVER MAYO STAND REUSABLE (DRAPES) ×2 IMPLANT
COVER WAND RF STERILE (DRAPES) IMPLANT
CUFF TOURN SGL QUICK 18X4 (TOURNIQUET CUFF) ×2 IMPLANT
DECANTER SPIKE VIAL GLASS SM (MISCELLANEOUS) IMPLANT
DRAPE EXTREMITY T 121X128X90 (DISPOSABLE) ×2 IMPLANT
DRAPE SURG 17X23 STRL (DRAPES) ×2 IMPLANT
GAUZE SPONGE 4X4 12PLY STRL (GAUZE/BANDAGES/DRESSINGS) ×2 IMPLANT
GAUZE XEROFORM 1X8 LF (GAUZE/BANDAGES/DRESSINGS) ×2 IMPLANT
GLOVE BIOGEL PI IND STRL 6.5 (GLOVE) ×1 IMPLANT
GLOVE BIOGEL PI IND STRL 8.5 (GLOVE) ×1 IMPLANT
GLOVE BIOGEL PI INDICATOR 6.5 (GLOVE) ×1
GLOVE BIOGEL PI INDICATOR 8.5 (GLOVE) ×1
GLOVE ECLIPSE 6.5 STRL STRAW (GLOVE) ×2 IMPLANT
GLOVE SURG ORTHO 8.0 STRL STRW (GLOVE) ×2 IMPLANT
GOWN STRL REUS W/ TWL LRG LVL3 (GOWN DISPOSABLE) ×1 IMPLANT
GOWN STRL REUS W/TWL LRG LVL3 (GOWN DISPOSABLE) ×1
GOWN STRL REUS W/TWL XL LVL3 (GOWN DISPOSABLE) ×2 IMPLANT
NEEDLE PRECISIONGLIDE 27X1.5 (NEEDLE) ×2 IMPLANT
NS IRRIG 1000ML POUR BTL (IV SOLUTION) ×2 IMPLANT
PACK BASIN DAY SURGERY FS (CUSTOM PROCEDURE TRAY) ×2 IMPLANT
STOCKINETTE 4X48 STRL (DRAPES) ×2 IMPLANT
SUT ETHILON 4 0 PS 2 18 (SUTURE) ×2 IMPLANT
SYR BULB 3OZ (MISCELLANEOUS) ×2 IMPLANT
SYR CONTROL 10ML LL (SYRINGE) ×2 IMPLANT
TOWEL GREEN STERILE FF (TOWEL DISPOSABLE) ×4 IMPLANT
UNDERPAD 30X30 (UNDERPADS AND DIAPERS) ×2 IMPLANT

## 2019-01-08 NOTE — Anesthesia Preprocedure Evaluation (Signed)
Anesthesia Evaluation  Patient identified by MRN, date of birth, ID band Patient awake    Reviewed: Allergy & Precautions, NPO status , Patient's Chart, lab work & pertinent test results, reviewed documented beta blocker date and time   History of Anesthesia Complications Negative for: history of anesthetic complications  Airway Mallampati: III  TM Distance: >3 FB Neck ROM: Full    Dental no notable dental hx. (+) Teeth Intact   Pulmonary neg pulmonary ROS, former smoker,    Pulmonary exam normal        Cardiovascular hypertension, Pt. on medications and Pt. on home beta blockers Normal cardiovascular exam     Neuro/Psych PSYCHIATRIC DISORDERS Anxiety Depression negative neurological ROS     GI/Hepatic Neg liver ROS, GERD  ,  Endo/Other  negative endocrine ROS  Renal/GU negative Renal ROS  negative genitourinary   Musculoskeletal negative musculoskeletal ROS (+)   Abdominal   Peds  Hematology negative hematology ROS (+)   Anesthesia Other Findings   Reproductive/Obstetrics                            Anesthesia Physical Anesthesia Plan  ASA: II  Anesthesia Plan: Bier Block and Bier Block-LIDOCAINE ONLY   Post-op Pain Management:    Induction:   PONV Risk Score and Plan: 2 and Propofol infusion and Treatment may vary due to age or medical condition  Airway Management Planned: Simple Face Mask  Additional Equipment: None  Intra-op Plan:   Post-operative Plan:   Informed Consent: I have reviewed the patients History and Physical, chart, labs and discussed the procedure including the risks, benefits and alternatives for the proposed anesthesia with the patient or authorized representative who has indicated his/her understanding and acceptance.       Plan Discussed with:   Anesthesia Plan Comments:         Anesthesia Quick Evaluation

## 2019-01-08 NOTE — Anesthesia Postprocedure Evaluation (Signed)
Anesthesia Post Note  Patient: Kimberly [name]  Procedure(s) Performed: RIGHT RING FINGER RELEASE TRIGGER FINGER/A-1 PULLEY (Right Hand)     Patient location during evaluation: PACU Anesthesia Type: Bier Block Level of consciousness: awake and alert Pain management: pain level controlled Vital Signs Assessment: post-procedure vital signs reviewed and stable Respiratory status: spontaneous breathing, nonlabored ventilation and respiratory function stable Cardiovascular status: blood pressure returned to baseline and stable Postop Assessment: no apparent nausea or vomiting Anesthetic complications: no    Last Vitals:  Vitals:   01/08/19 0930 01/08/19 1000  BP: (!) 145/63 (!) 168/64  Pulse: (!) 56 (!) 57  Resp: 13 14  Temp:  36.5 C  SpO2: 100% 99%    Last Pain:  Vitals:   01/08/19 1000  TempSrc:   PainSc: 0-No pain                 Lucretia Kern

## 2019-01-08 NOTE — Brief Op Note (Signed)
01/08/2019  9:01 AM  PATIENT:  Kimberly [name]  73 y.o. female  PRE-OPERATIVE DIAGNOSIS:  RIGHT RING FINGER TRIGGER FINGER  POST-OPERATIVE DIAGNOSIS:  RIGHT RING FINGER TRIGGER FINGER  PROCEDURE:  Procedure(s) with comments: RIGHT RING FINGER RELEASE TRIGGER FINGER/A-1 PULLEY (Right) - FAB  SURGEON:  Surgeon(s) and Role:    * Cindee Salt, MD - Primary  PHYSICIAN ASSISTANT:   ASSISTANTS: none   ANESTHESIA:   local, regional and IV sedation  EBL:  2 mL   BLOOD ADMINISTERED:none  DRAINS: none   LOCAL MEDICATIONS USED:  BUPIVICAINE   SPECIMEN:  No Specimen  DISPOSITION OF SPECIMEN:  N/A  COUNTS:  YES  TOURNIQUET:   Total Tourniquet Time Documented: Forearm (Right) - 16 minutes Total: Forearm (Right) - 16 minutes   DICTATION: .Dragon Dictation  PLAN OF CARE: Discharge to home after PACU  PATIENT DISPOSITION:  PACU - hemodynamically stable.

## 2019-01-08 NOTE — Transfer of Care (Signed)
Immediate Anesthesia Transfer of Care Note  Patient: GENEVA BARRERO  Procedure(s) Performed: RIGHT RING FINGER RELEASE TRIGGER FINGER/A-1 PULLEY (Right Hand)  Patient Location: PACU  Anesthesia Type:MAC and Bier block  Level of Consciousness: awake, alert  and oriented  Airway & Oxygen Therapy: Patient Spontanous Breathing and Patient connected to face mask oxygen  Post-op Assessment: Report given to RN and Post -op Vital signs reviewed and stable  Post vital signs: Reviewed and stable  Last Vitals:  Vitals Value Taken Time  BP    Temp    Pulse 60 01/08/2019  9:05 AM  Resp    SpO2 97 % 01/08/2019  9:05 AM  Vitals shown include unvalidated device data.  Last Pain:  Vitals:   01/08/19 0730  TempSrc: Oral  PainSc: 0-No pain      Patients Stated Pain Goal: 0 (34/19/37 9024)  Complications: No apparent anesthesia complications

## 2019-01-08 NOTE — Op Note (Signed)
NAME: Kimberly [name] MEDICAL RECORD NO: [id number] DATE OF BIRTH: Jul 20, 1946 FACILITY: [facility] LOCATION: [facility] PHYSICIAN: Nicki Reaper, MD   OPERATIVE REPORT   DATE OF PROCEDURE: 01/08/19    PREOPERATIVE DIAGNOSIS:   Stenosing tenosynovitis right ring finger   POSTOPERATIVE DIAGNOSIS:   Same   PROCEDURE:   Release A1 pulley right ring finger   SURGEON: Cindee Salt, M.D.   ASSISTANT: none   ANESTHESIA:  Bier block with sedation and Local   INTRAVENOUS FLUIDS:  Per anesthesia flow sheet.   ESTIMATED BLOOD LOSS:  Minimal.   COMPLICATIONS:  None.   SPECIMENS:  none   TOURNIQUET TIME:    Total Tourniquet Time Documented: Forearm (Right) - 16 minutes Total: Forearm (Right) - 16 minutes    DISPOSITION:  Stable to PACU.   INDICATIONS: Patient is a 73 year old female with a history of catching of her right ring finger this is not responded to conservative treatment.  This is required her using her opposite hand to straighten it.  It is worse in the morning.  She has had 2 injections without relief and has continued catching.  She has elected undergo surgical release of the A1 pulley.  Pre-peri-and postoperative course been discussed along with risks and complications.  She is aware that there is no guarantee to the surgery the possibility of infection recurrence injury to arteries nerves tendons and complete relief symptoms and dystrophy.  In the preoperative area the patient is seen extremity marked by both patient and surgeon antibiotic given  OPERATIVE COURSE: The patient is brought to the operating room where a forearm-based IV regional anesthetic was carried out without difficulty.  She was prepped using ChloraPrep in a supine position with the right arm free.  A three-minute dry time was allowed and a timeout taken to confirm patient procedure.  An oblique incision was made over the right ring finger metacarpal phalangeal joint A1 pulley area carried  down through subcutaneous tissue.  Retractors were placed retracting the neurovascular bundles radially and ulnarly.  The A1 pulley was identified this was very released on its radial aspect a small incision was made centrally and A2 a very significant Tina synovitis was present proximally the 2 tendons were separated had a partial tenosynovectomy performed.  The finger was placed through full passive range of motion no further triggering was noted.  The wound was copious irrigated with saline and closed with interrupted 4-0 nylon sutures.  Local infiltration quarter percent bupivacaine without epinephrine was given approximately 8 cc was used.  Sterile compressive dressing with the fingers 3 was applied.  Inflation of the tourniquet all fingers immediately pink.  She was able to fully flex and extend the finger without any further catching following removal of the tourniquet.  She was taken to the recovery room for observation in satisfactory condition.  She will be discharged home to [facility] in 1 week on Tylenol ibuprofen for pain with 10 Norco 12/28/2023 as a backup.  Cindee Salt, MD Electronically signed, 01/08/19

## 2019-01-08 NOTE — H&P (Signed)
Kimberly [name] is an 73 y.o. female.   Chief Complaint:catchiOlin Hauserng right ring finger ZOX:[id number]:Arisbeth is a 73 year old right-hand-dominant female comes in with a complaint of catching of her right ring finger in the spring approximately 2 years. She recalls no history of injury. She has tried using splints at night without relief. She has a dull throbbing aching pain when it catches with a VAS score 7/10. Is worse in the morning. He has not tried taking anything for this but is on Celebrex for her back. .. She had a second injection to the A1 pulley. She states her pain is gone but the finger continues to catching requiring her to use her opposite hand to straighten it out. She has a history of arthritis no history of diabetes thyroid problems or gout. Family history is negative for all.   Past Medical History:  Diagnosis Date  . Anxiety   . Arthritis 07/06/11   right thumb  . Depression   . GERD (gastroesophageal reflux disease)   . Hypertension   . Trigger finger of right hand    ring finger    Past Surgical History:  Procedure Laterality Date  . ABDOMINAL HYSTERECTOMY  approx. 2008  . BACK SURGERY  2013   lumb fusion  . BACK SURGERY  10/2015  . BREAST EXCISIONAL BIOPSY Left    benign  . BREAST LUMPECTOMY  appro. 1990's   left - benign  . COLONOSCOPY    . KNEE ARTHROSCOPY     both scopes  . SHOULDER ARTHROSCOPY  5/15   right  . SHOULDER ARTHROSCOPY WITH ROTATOR CUFF REPAIR AND SUBACROMIAL DECOMPRESSION Right 07/30/2014   Procedure: RIGHT SHOULDER ARTHROSCOPY WITH REVISION ACROMIOPLASTY, DEBRIDEMENT AND REVISION OPEN ROTATOR CUFF REPAIR   ;  Surgeon: Thera FlakeW D Caffrey Jr., MD;  Location: [facility];  Service: Orthopedics;  Laterality: Right;  . TONSILLECTOMY      Family History  Problem Relation Age of Onset  . Cancer Brother   . Coronary artery disease Father    Social History:  reports that she quit smoking about 27 years ago. Her smoking use included cigarettes.  She quit after 15.00 years of use. She has never used smokeless tobacco. She reports current alcohol use. She reports that she does not use drugs.  Allergies:  Allergies  Allergen Reactions  . Flexeril [Cyclobenzaprine Hcl] Other (See Comments)    Violently ill  . Penicillins Other (See Comments)    unsure  . Skelaxin Nausea And Vomiting  . Cyclobenzaprine Nausea And Vomiting  . Metaxalone Nausea And Vomiting    No medications prior to admission.    Results for orders placed or performed during the hospital encounter of 01/06/19 (from the past 48 hour(s))  Novel Coronavirus, NAA (hospital order; send-out to ref lab)     Status: None   Collection Time: 01/06/19  9:02 AM  Result Value Ref Range   SARS-CoV-2, NAA NOT DETECTED NOT DETECTED    Comment: (NOTE) This test was developed and its performance characteristics determined by [facility]. This test has not been FDA cleared or approved. This test has been authorized by FDA under an Emergency Use Authorization (EUA). This test is only authorized for the duration of time the declaration that circumstances exist justifying the authorization of the emergency use of in vitro diagnostic tests for detection of SARS-CoV-2 virus and/or diagnosis of COVID-19 infection under section 564(b)(1) of the Act, 21 U.S.C. 098JXB-1(Y)(7360bbb-3(b)(1), unless the authorization is terminated or revoked sooner. When  diagnostic testing is negative, the possibility of a false negative result should be considered in the context of a patient's recent exposures and the presence of clinical signs and symptoms consistent with COVID-19. An individual without symptoms of COVID-19 and who is not shedding SARS-CoV-2 virus would expect to have a negative (not detected) result in this assay. Performed  At: [facility] [address] [place], [place] [id number] Jolene Schimke MD IA:[id number]    Coronavirus Source NASOPHARYNGEAL     Comment: Performed at  [facility], [address], [place], [place] [address]    No results found.   Pertinent items are noted in HPI.  Height 5\' 2"  (1.575 m), weight 52.6 kg.  General appearance: alert, cooperative and appears stated age Head: Normocephalic, without obvious abnormality Neck: no JVD Resp: clear to auscultation bilaterally Cardio: regular rate and rhythm, S1, S2 normal, no murmur, click, rub or gallop GI: soft, non-tender; bowel sounds normal; no masses,  no organomegaly Extremities: catching right ring finger Pulses: 2+ and symmetric Skin: Skin color, texture, turgor normal. No rashes or lesions Neurologic: Grossly normal Incision/Wound: na  Assessment/Plan Assessment:  1. Trigger ring finger of right hand    Plan: She is advised to have surgical release of the A1 pulley right ring finger. Pre-peri-and postoperative course are discussed along with risks and complications. She is aware that there is no guarantee to the surgery the possibility of infection recurrence injury to arteries nerves tendons complete relief symptoms dystrophy. She will be scheduled for release A1 pulley right ring finger as an outpatient under regional anesthesia.   Cindee Salt 01/08/2019, 4:58 AM

## 2019-01-08 NOTE — Discharge Instructions (Addendum)

## 2019-01-09 ENCOUNTER — Encounter (HOSPITAL_BASED_OUTPATIENT_CLINIC_OR_DEPARTMENT_OTHER): Payer: Self-pay | Admitting: Orthopedic Surgery

## 2019-05-12 ENCOUNTER — Other Ambulatory Visit: Payer: Self-pay | Admitting: Orthopaedic Surgery

## 2019-05-12 DIAGNOSIS — M4326 Fusion of spine, lumbar region: Secondary | ICD-10-CM

## 2019-05-12 DIAGNOSIS — M461 Sacroiliitis, not elsewhere classified: Secondary | ICD-10-CM

## 2019-05-19 ENCOUNTER — Ambulatory Visit
Admission: RE | Admit: 2019-05-19 | Discharge: 2019-05-19 | Disposition: A | Discharge status: Home / Self Care | Payer: Medicare Other | Source: Ambulatory Visit | Attending: Orthopaedic Surgery | Admitting: Orthopaedic Surgery

## 2019-05-19 DIAGNOSIS — M4326 Fusion of spine, lumbar region: Secondary | ICD-10-CM

## 2019-05-19 DIAGNOSIS — M461 Sacroiliitis, not elsewhere classified: Secondary | ICD-10-CM

## 2019-06-04 ENCOUNTER — Ambulatory Visit: Payer: Medicare Other | Admitting: Sports Medicine

## 2019-06-04 ENCOUNTER — Other Ambulatory Visit: Payer: Self-pay

## 2019-06-04 VITALS — BP 124/74 | Ht 62.0 in | Wt 117.0 lb

## 2019-06-04 DIAGNOSIS — M5126 Other intervertebral disc displacement, lumbar region: Secondary | ICD-10-CM

## 2019-06-04 NOTE — Progress Notes (Signed)
Kimberly [name] is a 73 y.o. female who presents to [facility] today as a new patient to discuss chronic back pain.  Left sided low back pain  Occurring x 7 months Have history of back problems and multiple ESI, but only 2 for this problem Aug 17 - dexamethazone injection, got some relief from this Oct 6 - injection in SI joint, but only worked for a few hours Has been seeing Dr. Maia Petties for this First back injury at 54, then 73 yo when it flared again Exercises regularly, walks a lot  Hx 3 level fusion, has had 2 surgeries for this, last surgery was about 2 years ago, likely mini-discectomy   Pain worse with sitting in chair Can walk without pain Occasional pain that radiates down left leg, rarely right radiation Weakness in left leg No changes in bowel or bladder habits No numbness   PMH reviewed.  ROS as above. Medications reviewed.  Exam:  BP 124/74   Ht 5\' 2"  (1.575 m)   Wt 117 lb (53.1 kg)   BMI 21.40 kg/m  Gen: Well NAD MSK:  Lumbar spine: - Inspection: Left shoulder carried higher than right, midline scar at lumbar spine, no overlying redness or obvious deformity - Palpation: No TTP over the spinous processes, paraspinal muscles, TTP left SI joint  - ROM: full active ROM of the lumbar spine in flexion and extension without pain - Strength: 5/5 strength of lower extremity in L4-S1 nerve root distributions b/l; normal gait - Neuro: sensation intact in the L4-S1 nerve root distribution b/l, 1+ L4 reflexes.  2+ S1 reflexes - Special testing:  Negative FABER, Negative FADIR, Negative Gaenselen's   Ct Lumbar Spine Wo Contrast  Result Date: 05/19/2019 CLINICAL DATA:  Low back pain, pelvic pain EXAM: CT LUMBAR SPINE WITHOUT CONTRAST TECHNIQUE: Multidetector CT imaging of the lumbar spine was performed without intravenous contrast administration. Multiplanar CT image reconstructions were also generated. COMPARISON:  MR lumbar spine 07/23/2017 FINDINGS: Segmentation: 5 lumbar  type vertebrae. Alignment: Normal. Vertebrae: No acute fracture or focal pathologic process. Paraspinal and other soft tissues: No acute paraspinal abnormality. Abdominal aortic atherosclerosis. Disc levels: Posterior lumbar interbody fusion from L3 through S1 with bilateral pedicle screws at L3 and L4 and removal of hardware at L5-S1. Left anterior lumbar interbody fusion at L2-3. Osseous bridging across the disc spaces. Degenerative disc disease with disc height loss at L1-2. T12-L1: Mild broad-based disc bulge.  No foraminal stenosis. L1-L2: Mild broad-based disc bulge. Mild bilateral facet arthropathy. No foraminal stenosis. L2-L3: Broad-based disc bulge. Moderate bilateral facet arthropathy. Mild spinal stenosis. Mild left foraminal stenosis. No right foraminal stenosis. L3-L4: Interbody fusion.  Prior laminectomy.  No foraminal stenosis. L4-L5: Interbody fusion.  No foraminal stenosis.  Prior laminectomy. L5-S1: Interbody fusion with osseous ridging along the right neural foramen with mild narrowing. Prior laminectomy. IMPRESSION: 1. No acute osseous injury of the lumbar spine. Lumbar spine spondylosis as described above. 2. Posterior lumbar interbody fusion from L3 through S1 with bilateral pedicle screws at L3 and L4 and removal of hardware at L5-S1. Left anterior lumbar interbody fusion at L2-3. Osseous bridging across the disc spaces. 3. Moderate osteoarthritis of bilateral SI joints. 4.  Aortic Atherosclerosis (ICD10-I70.0). Electronically Signed   By: Kathreen Devoid   On: 05/19/2019 15:50   Ct Pelvis Wo Contrast  Result Date: 05/20/2019 CLINICAL DATA:  Low back and pelvic pain for 3 months. EXAM: CT PELVIS WITHOUT CONTRAST TECHNIQUE: Multidetector CT imaging of the pelvis was performed following  the standard protocol without intravenous contrast. COMPARISON:  None. FINDINGS: Urinary Tract:  No abnormality visualized. Bowel: There is no evidence of bowel obstruction or inflammation. There appears to be  3 well corticated bony densities within a small bowel loop which most likely represent ingested objects. Vascular/Lymphatic: No pathologically enlarged lymph nodes. No significant vascular abnormality seen. Reproductive: Status post hysterectomy. No adnexal abnormality is noted. Other:  No hernia or abnormal fluid collection is noted. Musculoskeletal: Postsurgical changes are noted in the visualized lower lumbar spine, including L5 and S1 laminectomy. Sacroiliac and hip joints are unremarkable. No fracture or other acute osseous abnormality is noted. IMPRESSION: Postsurgical changes are noted in the visualized lower lumbar spine and sacrum. Hip and sacroiliac joints are unremarkable. No acute osseous abnormality seen. Electronically Signed   By: Lupita Raider M.D.   On: 05/20/2019 08:32     Assessment and Plan: 1) Lumbar discogenic pain syndrome Given history, exam, and review of prior lumbar spine MRI and CT from 10/2018 and 04/2019, respectively, suspect discogenic pain from lumbar spine spondylosis.  No red flag symptoms including changes in bowel or bladder habits.  She does have SI joint arthritis, but no pain with FABER and history of pain is more consistent with discogenic pain, given she has pain only when sitting.  Instructed on Williams Flexion exercises to perform.  Advised to avoid use of Norco and celebrex unless pain is severe.  Patient to follow up in 2 months or sooner as needed.   Luis Abed, D.O.  PGY-2 Family Medicine  06/04/2019 4:48 PM  I observed and examined the patient with the resident and agree with assessment and plan.  Note reviewed and modified by me. Sterling Big, MD

## 2019-06-04 NOTE — Assessment & Plan Note (Signed)
Given history, exam, and review of prior lumbar spine MRI and CT from 10/2018 and 04/2019, respectively, suspect discogenic pain from lumbar spine spondylosis.  No red flag symptoms including changes in bowel or bladder habits.  She does have SI joint arthritis, but no pain with FABER and history of pain is more consistent with discogenic pain, given she has pain only when sitting.  Instructed on Williams Flexion exercises to perform.  Advised to avoid use of Norco and celebrex unless pain is severe.  Patient to follow up in 2 months or sooner as needed.

## 2019-06-04 NOTE — Patient Instructions (Signed)
Perform the following stretches every day:  Lay on your back and tighten you abdomen.  Hold for 10 seconds.  While on your back, bring your knee to your chest and hold for 5 seconds.  Repeat on both sides.  While on your back, bring you knee to your opposite shoulder.  Hold for 5 seconds.  Repeat on both side.  While on your back, bring both knees to your chest and rock side to side 10 times.   If you are having pain while sitting, you can bring your knees to your chest and to your opposite shoulder.  You can also bring your chest to your knees and shoulder to your opposite knee.  This stretches you back.  Reserve your pain medications for severe pain.  Follow up in 2 months or sooner as needed.

## 2019-07-15 ENCOUNTER — Other Ambulatory Visit: Payer: Self-pay | Admitting: Family Medicine

## 2019-07-15 DIAGNOSIS — Z1231 Encounter for screening mammogram for malignant neoplasm of breast: Secondary | ICD-10-CM

## 2019-07-16 ENCOUNTER — Ambulatory Visit
Admission: RE | Admit: 2019-07-16 | Discharge: 2019-07-16 | Disposition: A | Discharge status: Home / Self Care | Payer: Medicare Other | Source: Ambulatory Visit | Attending: Family Medicine | Admitting: Family Medicine

## 2019-07-16 ENCOUNTER — Other Ambulatory Visit: Payer: Self-pay

## 2019-07-16 DIAGNOSIS — Z1231 Encounter for screening mammogram for malignant neoplasm of breast: Secondary | ICD-10-CM

## 2019-08-04 ENCOUNTER — Ambulatory Visit: Payer: Medicare Other | Admitting: Sports Medicine

## 2019-09-24 ENCOUNTER — Ambulatory Visit: Payer: Medicare Other

## 2019-10-03 ENCOUNTER — Ambulatory Visit: Payer: Medicare Other

## 2019-10-04 ENCOUNTER — Ambulatory Visit: Payer: Medicare PPO | Attending: Internal Medicine

## 2019-10-04 DIAGNOSIS — Z23 Encounter for immunization: Secondary | ICD-10-CM | POA: Insufficient documentation

## 2019-10-04 NOTE — Progress Notes (Signed)
   Covid-19 Vaccination Clinic  Name:  JOLIENE SALVADOR    MRN: [id number] DOB: [birth year]  10/04/2019  Ms. Cremeans was observed post Covid-19 immunization for 15 minutes without incidence. She was provided with Vaccine Information Sheet and instruction to access the V-Safe system.   Ms. Kukla was instructed to call 911 with any severe reactions post vaccine: Marland Kitchen Difficulty breathing  . Swelling of your face and throat  . A fast heartbeat  . A bad rash all over your body  . Dizziness and weakness    Immunizations Administered    Name Date Dose VIS Date Route   Pfizer COVID-19 Vaccine 10/04/2019  9:17 AM 0.3 mL 08/07/2019 Intramuscular   Manufacturer: [employer], [employer]   Lot: [id number]   NDC: [address]-4

## 2019-10-21 DIAGNOSIS — H6123 Impacted cerumen, bilateral: Secondary | ICD-10-CM | POA: Insufficient documentation

## 2019-10-27 ENCOUNTER — Other Ambulatory Visit: Payer: Self-pay | Admitting: Podiatry

## 2019-10-27 ENCOUNTER — Encounter: Payer: Self-pay | Admitting: Podiatry

## 2019-10-27 ENCOUNTER — Other Ambulatory Visit: Payer: Self-pay

## 2019-10-27 ENCOUNTER — Ambulatory Visit: Payer: Medicare PPO | Admitting: Podiatry

## 2019-10-27 ENCOUNTER — Ambulatory Visit (INDEPENDENT_AMBULATORY_CARE_PROVIDER_SITE_OTHER): Payer: Medicare PPO

## 2019-10-27 VITALS — Temp 98.0°F

## 2019-10-27 DIAGNOSIS — M19079 Primary osteoarthritis, unspecified ankle and foot: Secondary | ICD-10-CM | POA: Diagnosis not present

## 2019-10-27 DIAGNOSIS — M76822 Posterior tibial tendinitis, left leg: Secondary | ICD-10-CM

## 2019-10-27 DIAGNOSIS — M779 Enthesopathy, unspecified: Secondary | ICD-10-CM

## 2019-10-27 DIAGNOSIS — M778 Other enthesopathies, not elsewhere classified: Secondary | ICD-10-CM

## 2019-10-27 NOTE — Patient Instructions (Signed)
For instructions on how to put on your Tri-Lock Ankle Brace, please visit [email or web address] 

## 2019-10-28 ENCOUNTER — Ambulatory Visit: Payer: Medicare PPO | Attending: Internal Medicine

## 2019-10-28 DIAGNOSIS — Z23 Encounter for immunization: Secondary | ICD-10-CM | POA: Insufficient documentation

## 2019-10-28 NOTE — Progress Notes (Signed)
Subjective:   Patient ID: Kimberly [name], female   DOB: 74 y.o.   MRN: [id number]   HPI 74 year old female presents the office today for concerns of medial foot pain on the left side which is been aching on the last 1/2 weeks.  We treated a similar issue back does not 18 and she has been wearing orthotics and she states that she is minimal to tolerate the foot pain.  She has noticed a mild swelling and she is been wearing a Tri-Lock brace but not sure how to put it on.  She also is a boot.  No recent injury or falls.  Mild swelling but no redness or warmth.   ROS  Past Medical History:  Diagnosis Date  . Anxiety   . Arthritis 07/06/11   right thumb  . Depression   . GERD (gastroesophageal reflux disease)   . Hypertension   . Trigger finger of right hand    ring finger    Past Surgical History:  Procedure Laterality Date  . ABDOMINAL HYSTERECTOMY  approx. 2008  . BACK SURGERY  2013   lumb fusion  . BACK SURGERY  10/2015  . BREAST EXCISIONAL BIOPSY Left    benign  . BREAST LUMPECTOMY  appro. 1990's   left - benign  . COLONOSCOPY    . KNEE ARTHROSCOPY     both scopes  . SHOULDER ARTHROSCOPY  5/15   right  . SHOULDER ARTHROSCOPY WITH ROTATOR CUFF REPAIR AND SUBACROMIAL DECOMPRESSION Right 07/30/2014   Procedure: RIGHT SHOULDER ARTHROSCOPY WITH REVISION ACROMIOPLASTY, DEBRIDEMENT AND REVISION OPEN ROTATOR CUFF REPAIR   ;  Surgeon: Thera Flake., MD;  Location: [facility];  Service: Orthopedics;  Laterality: Right;  . TONSILLECTOMY    . TRIGGER FINGER RELEASE Right 01/08/2019   Procedure: RIGHT RING FINGER RELEASE TRIGGER FINGER/A-1 PULLEY;  Surgeon: Cindee Salt, MD;  Location: [facility];  Service: Orthopedics;  Laterality: Right;  FAB     Current Outpatient Medications:  .  atorvastatin (LIPITOR) 20 MG tablet, , Disp: , Rfl:  .  cholecalciferol (VITAMIN D) 1000 UNITS tablet, Take 1,000 Units by mouth daily.  , Disp: , Rfl:  .  metoprolol  tartrate (LOPRESSOR) 25 MG tablet, Take 1 tablet (25 mg total) by mouth 2 (two) times daily., Disp: 62 tablet, Rfl: 0 .  pantoprazole (PROTONIX) 40 MG tablet, Take 40 mg by mouth as directed., Disp: , Rfl:  .  PROLENSA 0.07 % SOLN, Place 1 drop into the left eye daily., Disp: , Rfl:  .  sertraline (ZOLOFT) 100 MG tablet, , Disp: , Rfl:   Allergies  Allergen Reactions  . Flexeril [Cyclobenzaprine Hcl] Other (See Comments)    Violently ill  . Penicillins Other (See Comments)    unsure  . Skelaxin Nausea And Vomiting  . Cyclobenzaprine Nausea And Vomiting  . Metaxalone Nausea And Vomiting         Objective:  Physical Exam  General: AAO x3, NAD  Dermatological: Skin is warm, dry and supple bilateral. Nails x 10 are well manicured; remaining integument appears unremarkable at this time. There are no open sores, no preulcerative lesions, no rash or signs of infection present.  Vascular: Dorsalis Pedis artery and Posterior Tibial artery pedal pulses are 2/4 bilateral with immedate capillary fill time. There is no pain with calf compression, swelling, warmth, erythema.   Neruologic: Grossly intact via light touch bilateral.   Musculoskeletal: There is tenderness palpation on the medial aspect  the foot most of the navicular cuneiform joint as well as the to the navicular joint.  No pain on the tibialis anterior, flexor, extensor tendons.  Overall attempts.  Intact.  Minimal edema there is no erythema or warmth.  There is prominence on the plantar aspect of foot on the joint this is with majority of tenderness is localized.  Muscular strength 5/5 in all groups tested bilateral.  Gait: Unassisted, Nonantalgic.       Assessment:   Arthritis left foot, capsulitis     Plan:  -Treatment options discussed including all alternatives, risks, and complications -Etiology of symptoms were discussed -X-rays obtained and reviewed.  Arthritic changes present in the midfoot.  Not able to identify  any evidence of acute fracture or stress fracture. -We discussed steroid injection but she wants to hold off.  Ultimately I do think that we need to either resurface the orthotics there offload the prominent areas.  She does not have them with her unfortunately.  I will have her follow-up with Betha on Friday for possible orthotic modifications.  Voltaren gel as needed. Continue trilock for now.   Trula Slade DPM

## 2019-10-28 NOTE — Progress Notes (Signed)
   Covid-19 Vaccination Clinic  Name:  Kimberly [name]    MRN: [id number] DOB: 03-29-1946  10/28/2019  [name] was observed post Covid-19 immunization for 15 minutes without incident. She was provided with Vaccine Information Sheet and instruction to access the V-Safe system.   [name] was instructed to call 911 with any severe reactions post vaccine: Marland Kitchen Difficulty breathing  . Swelling of face and throat  . A fast heartbeat  . A bad rash all over body  . Dizziness and weakness   Immunizations Administered    Name Date Dose VIS Date Route   Pfizer COVID-19 Vaccine 10/28/2019  4:16 PM 0.3 mL 08/07/2019 Intramuscular   Manufacturer: [employer], [employer]   Lot: [id number]   NDC: [address]-5

## 2019-11-10 ENCOUNTER — Ambulatory Visit: Payer: Medicare Other | Admitting: Podiatry

## 2019-11-11 ENCOUNTER — Other Ambulatory Visit: Payer: Medicare PPO | Admitting: Orthotics

## 2019-11-11 ENCOUNTER — Other Ambulatory Visit: Payer: Self-pay

## 2019-12-15 ENCOUNTER — Ambulatory Visit: Payer: Medicare PPO | Admitting: Physician Assistant

## 2020-01-26 DIAGNOSIS — R531 Weakness: Secondary | ICD-10-CM | POA: Diagnosis not present

## 2020-01-26 DIAGNOSIS — Z789 Other specified health status: Secondary | ICD-10-CM | POA: Diagnosis not present

## 2020-01-26 DIAGNOSIS — R2689 Other abnormalities of gait and mobility: Secondary | ICD-10-CM | POA: Diagnosis not present

## 2020-01-26 DIAGNOSIS — Z7409 Other reduced mobility: Secondary | ICD-10-CM | POA: Diagnosis not present

## 2020-01-27 DIAGNOSIS — L821 Other seborrheic keratosis: Secondary | ICD-10-CM | POA: Diagnosis not present

## 2020-01-27 DIAGNOSIS — D229 Melanocytic nevi, unspecified: Secondary | ICD-10-CM | POA: Diagnosis not present

## 2020-01-27 DIAGNOSIS — I781 Nevus, non-neoplastic: Secondary | ICD-10-CM | POA: Diagnosis not present

## 2020-01-27 DIAGNOSIS — C44712 Basal cell carcinoma of skin of right lower limb, including hip: Secondary | ICD-10-CM | POA: Diagnosis not present

## 2020-02-05 DIAGNOSIS — M4328 Fusion of spine, sacral and sacrococcygeal region: Secondary | ICD-10-CM | POA: Diagnosis not present

## 2020-02-05 DIAGNOSIS — M4327 Fusion of spine, lumbosacral region: Secondary | ICD-10-CM | POA: Diagnosis not present

## 2020-02-05 DIAGNOSIS — M7918 Myalgia, other site: Secondary | ICD-10-CM | POA: Diagnosis not present

## 2020-02-05 DIAGNOSIS — M545 Low back pain: Secondary | ICD-10-CM | POA: Diagnosis not present

## 2020-02-05 DIAGNOSIS — M461 Sacroiliitis, not elsewhere classified: Secondary | ICD-10-CM | POA: Diagnosis not present

## 2020-02-10 DIAGNOSIS — Z83511 Family history of glaucoma: Secondary | ICD-10-CM | POA: Diagnosis not present

## 2020-02-10 DIAGNOSIS — H04123 Dry eye syndrome of bilateral lacrimal glands: Secondary | ICD-10-CM | POA: Diagnosis not present

## 2020-02-10 DIAGNOSIS — H10413 Chronic giant papillary conjunctivitis, bilateral: Secondary | ICD-10-CM | POA: Diagnosis not present

## 2020-02-10 DIAGNOSIS — L570 Actinic keratosis: Secondary | ICD-10-CM | POA: Diagnosis not present

## 2020-02-10 DIAGNOSIS — L719 Rosacea, unspecified: Secondary | ICD-10-CM | POA: Diagnosis not present

## 2020-02-12 DIAGNOSIS — Z981 Arthrodesis status: Secondary | ICD-10-CM | POA: Diagnosis not present

## 2020-02-12 DIAGNOSIS — S32611K Displaced avulsion fracture of right ischium, subsequent encounter for fracture with nonunion: Secondary | ICD-10-CM | POA: Diagnosis not present

## 2020-02-12 DIAGNOSIS — M67951 Unspecified disorder of synovium and tendon, right thigh: Secondary | ICD-10-CM | POA: Diagnosis not present

## 2020-02-12 DIAGNOSIS — M461 Sacroiliitis, not elsewhere classified: Secondary | ICD-10-CM | POA: Diagnosis not present

## 2020-02-15 DIAGNOSIS — G629 Polyneuropathy, unspecified: Secondary | ICD-10-CM | POA: Diagnosis not present

## 2020-02-15 DIAGNOSIS — M1990 Unspecified osteoarthritis, unspecified site: Secondary | ICD-10-CM | POA: Diagnosis not present

## 2020-02-15 DIAGNOSIS — K219 Gastro-esophageal reflux disease without esophagitis: Secondary | ICD-10-CM | POA: Diagnosis not present

## 2020-02-15 DIAGNOSIS — I10 Essential (primary) hypertension: Secondary | ICD-10-CM | POA: Diagnosis not present

## 2020-02-15 DIAGNOSIS — R32 Unspecified urinary incontinence: Secondary | ICD-10-CM | POA: Diagnosis not present

## 2020-02-15 DIAGNOSIS — Z809 Family history of malignant neoplasm, unspecified: Secondary | ICD-10-CM | POA: Diagnosis not present

## 2020-02-15 DIAGNOSIS — G8929 Other chronic pain: Secondary | ICD-10-CM | POA: Diagnosis not present

## 2020-02-15 DIAGNOSIS — E785 Hyperlipidemia, unspecified: Secondary | ICD-10-CM | POA: Diagnosis not present

## 2020-02-15 DIAGNOSIS — Z791 Long term (current) use of non-steroidal anti-inflammatories (NSAID): Secondary | ICD-10-CM | POA: Diagnosis not present

## 2020-02-23 DIAGNOSIS — M4327 Fusion of spine, lumbosacral region: Secondary | ICD-10-CM | POA: Diagnosis not present

## 2020-02-23 DIAGNOSIS — M4326 Fusion of spine, lumbar region: Secondary | ICD-10-CM | POA: Diagnosis not present

## 2020-02-23 DIAGNOSIS — M461 Sacroiliitis, not elsewhere classified: Secondary | ICD-10-CM | POA: Diagnosis not present

## 2020-03-07 DIAGNOSIS — M461 Sacroiliitis, not elsewhere classified: Secondary | ICD-10-CM | POA: Diagnosis not present

## 2020-03-15 DIAGNOSIS — M25562 Pain in left knee: Secondary | ICD-10-CM | POA: Diagnosis not present

## 2020-03-25 DIAGNOSIS — M461 Sacroiliitis, not elsewhere classified: Secondary | ICD-10-CM | POA: Diagnosis not present

## 2020-03-25 DIAGNOSIS — M25562 Pain in left knee: Secondary | ICD-10-CM | POA: Diagnosis not present

## 2020-03-25 DIAGNOSIS — Z6821 Body mass index (BMI) 21.0-21.9, adult: Secondary | ICD-10-CM | POA: Diagnosis not present

## 2020-03-25 DIAGNOSIS — M4327 Fusion of spine, lumbosacral region: Secondary | ICD-10-CM | POA: Diagnosis not present

## 2020-04-07 DIAGNOSIS — M25562 Pain in left knee: Secondary | ICD-10-CM | POA: Diagnosis not present

## 2020-05-11 DIAGNOSIS — S83232A Complex tear of medial meniscus, current injury, left knee, initial encounter: Secondary | ICD-10-CM | POA: Diagnosis not present

## 2020-05-11 DIAGNOSIS — S83272A Complex tear of lateral meniscus, current injury, left knee, initial encounter: Secondary | ICD-10-CM | POA: Diagnosis not present

## 2020-05-11 DIAGNOSIS — S83242A Other tear of medial meniscus, current injury, left knee, initial encounter: Secondary | ICD-10-CM | POA: Diagnosis not present

## 2020-05-11 DIAGNOSIS — S83282A Other tear of lateral meniscus, current injury, left knee, initial encounter: Secondary | ICD-10-CM | POA: Diagnosis not present

## 2020-05-11 DIAGNOSIS — M94262 Chondromalacia, left knee: Secondary | ICD-10-CM | POA: Diagnosis not present

## 2020-05-11 DIAGNOSIS — M1712 Unilateral primary osteoarthritis, left knee: Secondary | ICD-10-CM | POA: Diagnosis not present

## 2020-05-17 DIAGNOSIS — Z20828 Contact with and (suspected) exposure to other viral communicable diseases: Secondary | ICD-10-CM | POA: Diagnosis not present

## 2020-05-17 DIAGNOSIS — Z20822 Contact with and (suspected) exposure to covid-19: Secondary | ICD-10-CM | POA: Diagnosis not present

## 2020-05-26 DIAGNOSIS — R262 Difficulty in walking, not elsewhere classified: Secondary | ICD-10-CM | POA: Diagnosis not present

## 2020-05-26 DIAGNOSIS — M25562 Pain in left knee: Secondary | ICD-10-CM | POA: Diagnosis not present

## 2020-05-26 DIAGNOSIS — M6281 Muscle weakness (generalized): Secondary | ICD-10-CM | POA: Diagnosis not present

## 2020-05-26 DIAGNOSIS — S83242D Other tear of medial meniscus, current injury, left knee, subsequent encounter: Secondary | ICD-10-CM | POA: Diagnosis not present

## 2020-06-01 DIAGNOSIS — M25562 Pain in left knee: Secondary | ICD-10-CM | POA: Diagnosis not present

## 2020-06-01 DIAGNOSIS — R262 Difficulty in walking, not elsewhere classified: Secondary | ICD-10-CM | POA: Diagnosis not present

## 2020-06-01 DIAGNOSIS — M6281 Muscle weakness (generalized): Secondary | ICD-10-CM | POA: Diagnosis not present

## 2020-06-01 DIAGNOSIS — S83242D Other tear of medial meniscus, current injury, left knee, subsequent encounter: Secondary | ICD-10-CM | POA: Diagnosis not present

## 2020-06-08 DIAGNOSIS — S83282D Other tear of lateral meniscus, current injury, left knee, subsequent encounter: Secondary | ICD-10-CM | POA: Diagnosis not present

## 2020-06-08 DIAGNOSIS — M6281 Muscle weakness (generalized): Secondary | ICD-10-CM | POA: Diagnosis not present

## 2020-06-08 DIAGNOSIS — S83242D Other tear of medial meniscus, current injury, left knee, subsequent encounter: Secondary | ICD-10-CM | POA: Diagnosis not present

## 2020-06-08 DIAGNOSIS — R262 Difficulty in walking, not elsewhere classified: Secondary | ICD-10-CM | POA: Diagnosis not present

## 2020-06-10 DIAGNOSIS — S83242D Other tear of medial meniscus, current injury, left knee, subsequent encounter: Secondary | ICD-10-CM | POA: Diagnosis not present

## 2020-06-10 DIAGNOSIS — M6281 Muscle weakness (generalized): Secondary | ICD-10-CM | POA: Diagnosis not present

## 2020-06-10 DIAGNOSIS — R262 Difficulty in walking, not elsewhere classified: Secondary | ICD-10-CM | POA: Diagnosis not present

## 2020-06-10 DIAGNOSIS — S83282D Other tear of lateral meniscus, current injury, left knee, subsequent encounter: Secondary | ICD-10-CM | POA: Diagnosis not present

## 2020-06-13 DIAGNOSIS — R262 Difficulty in walking, not elsewhere classified: Secondary | ICD-10-CM | POA: Diagnosis not present

## 2020-06-13 DIAGNOSIS — S83282D Other tear of lateral meniscus, current injury, left knee, subsequent encounter: Secondary | ICD-10-CM | POA: Diagnosis not present

## 2020-06-13 DIAGNOSIS — S83242D Other tear of medial meniscus, current injury, left knee, subsequent encounter: Secondary | ICD-10-CM | POA: Diagnosis not present

## 2020-06-13 DIAGNOSIS — M6281 Muscle weakness (generalized): Secondary | ICD-10-CM | POA: Diagnosis not present

## 2020-06-15 DIAGNOSIS — R269 Unspecified abnormalities of gait and mobility: Secondary | ICD-10-CM | POA: Diagnosis not present

## 2020-06-15 DIAGNOSIS — M25562 Pain in left knee: Secondary | ICD-10-CM | POA: Diagnosis not present

## 2020-06-15 DIAGNOSIS — S83242D Other tear of medial meniscus, current injury, left knee, subsequent encounter: Secondary | ICD-10-CM | POA: Diagnosis not present

## 2020-06-15 DIAGNOSIS — M6281 Muscle weakness (generalized): Secondary | ICD-10-CM | POA: Diagnosis not present

## 2020-06-20 ENCOUNTER — Other Ambulatory Visit: Payer: Self-pay | Admitting: Family Medicine

## 2020-06-20 DIAGNOSIS — R262 Difficulty in walking, not elsewhere classified: Secondary | ICD-10-CM | POA: Diagnosis not present

## 2020-06-20 DIAGNOSIS — Z1231 Encounter for screening mammogram for malignant neoplasm of breast: Secondary | ICD-10-CM

## 2020-06-20 DIAGNOSIS — S83242D Other tear of medial meniscus, current injury, left knee, subsequent encounter: Secondary | ICD-10-CM | POA: Diagnosis not present

## 2020-06-20 DIAGNOSIS — M6281 Muscle weakness (generalized): Secondary | ICD-10-CM | POA: Diagnosis not present

## 2020-06-20 DIAGNOSIS — M25562 Pain in left knee: Secondary | ICD-10-CM | POA: Diagnosis not present

## 2020-06-23 DIAGNOSIS — M6281 Muscle weakness (generalized): Secondary | ICD-10-CM | POA: Diagnosis not present

## 2020-06-23 DIAGNOSIS — R262 Difficulty in walking, not elsewhere classified: Secondary | ICD-10-CM | POA: Diagnosis not present

## 2020-06-23 DIAGNOSIS — M25562 Pain in left knee: Secondary | ICD-10-CM | POA: Diagnosis not present

## 2020-06-23 DIAGNOSIS — S83242D Other tear of medial meniscus, current injury, left knee, subsequent encounter: Secondary | ICD-10-CM | POA: Diagnosis not present

## 2020-06-27 DIAGNOSIS — M25562 Pain in left knee: Secondary | ICD-10-CM | POA: Diagnosis not present

## 2020-06-27 DIAGNOSIS — S83242D Other tear of medial meniscus, current injury, left knee, subsequent encounter: Secondary | ICD-10-CM | POA: Diagnosis not present

## 2020-06-27 DIAGNOSIS — R262 Difficulty in walking, not elsewhere classified: Secondary | ICD-10-CM | POA: Diagnosis not present

## 2020-06-27 DIAGNOSIS — M6281 Muscle weakness (generalized): Secondary | ICD-10-CM | POA: Diagnosis not present

## 2020-07-06 DIAGNOSIS — I10 Essential (primary) hypertension: Secondary | ICD-10-CM | POA: Diagnosis not present

## 2020-07-06 DIAGNOSIS — Z0000 Encounter for general adult medical examination without abnormal findings: Secondary | ICD-10-CM | POA: Diagnosis not present

## 2020-07-06 DIAGNOSIS — E7800 Pure hypercholesterolemia, unspecified: Secondary | ICD-10-CM | POA: Diagnosis not present

## 2020-07-06 DIAGNOSIS — F419 Anxiety disorder, unspecified: Secondary | ICD-10-CM | POA: Diagnosis not present

## 2020-07-13 DIAGNOSIS — E7800 Pure hypercholesterolemia, unspecified: Secondary | ICD-10-CM | POA: Diagnosis not present

## 2020-07-13 DIAGNOSIS — Z0000 Encounter for general adult medical examination without abnormal findings: Secondary | ICD-10-CM | POA: Diagnosis not present

## 2020-07-13 DIAGNOSIS — F419 Anxiety disorder, unspecified: Secondary | ICD-10-CM | POA: Diagnosis not present

## 2020-07-13 DIAGNOSIS — I10 Essential (primary) hypertension: Secondary | ICD-10-CM | POA: Diagnosis not present

## 2020-07-26 ENCOUNTER — Other Ambulatory Visit (HOSPITAL_COMMUNITY): Payer: Self-pay | Admitting: Orthopedic Surgery

## 2020-07-26 ENCOUNTER — Other Ambulatory Visit: Payer: Self-pay

## 2020-07-26 ENCOUNTER — Ambulatory Visit (HOSPITAL_COMMUNITY)
Admission: RE | Admit: 2020-07-26 | Discharge: 2020-07-26 | Disposition: A | Discharge status: Home / Self Care | Payer: Medicare PPO | Source: Ambulatory Visit | Attending: Orthopedic Surgery | Admitting: Orthopedic Surgery

## 2020-07-26 DIAGNOSIS — M7989 Other specified soft tissue disorders: Secondary | ICD-10-CM | POA: Diagnosis not present

## 2020-07-26 DIAGNOSIS — M79662 Pain in left lower leg: Secondary | ICD-10-CM | POA: Diagnosis not present

## 2020-07-26 DIAGNOSIS — M25562 Pain in left knee: Secondary | ICD-10-CM | POA: Diagnosis not present

## 2020-07-27 ENCOUNTER — Ambulatory Visit
Admission: RE | Admit: 2020-07-27 | Discharge: 2020-07-27 | Disposition: A | Discharge status: Home / Self Care | Payer: Medicare PPO | Source: Ambulatory Visit | Attending: Family Medicine | Admitting: Family Medicine

## 2020-07-27 DIAGNOSIS — Z1231 Encounter for screening mammogram for malignant neoplasm of breast: Secondary | ICD-10-CM | POA: Diagnosis not present

## 2020-08-24 DIAGNOSIS — M4327 Fusion of spine, lumbosacral region: Secondary | ICD-10-CM | POA: Diagnosis not present

## 2020-08-24 DIAGNOSIS — M47896 Other spondylosis, lumbar region: Secondary | ICD-10-CM | POA: Diagnosis not present

## 2020-08-24 DIAGNOSIS — M461 Sacroiliitis, not elsewhere classified: Secondary | ICD-10-CM | POA: Diagnosis not present

## 2020-09-08 ENCOUNTER — Ambulatory Visit: Payer: Medicare PPO | Admitting: Podiatry

## 2020-09-08 ENCOUNTER — Ambulatory Visit (INDEPENDENT_AMBULATORY_CARE_PROVIDER_SITE_OTHER): Payer: Medicare PPO

## 2020-09-08 ENCOUNTER — Other Ambulatory Visit: Payer: Self-pay

## 2020-09-08 DIAGNOSIS — M19079 Primary osteoarthritis, unspecified ankle and foot: Secondary | ICD-10-CM

## 2020-09-08 DIAGNOSIS — M461 Sacroiliitis, not elsewhere classified: Secondary | ICD-10-CM | POA: Diagnosis not present

## 2020-09-08 DIAGNOSIS — M779 Enthesopathy, unspecified: Secondary | ICD-10-CM

## 2020-09-08 DIAGNOSIS — M19072 Primary osteoarthritis, left ankle and foot: Secondary | ICD-10-CM

## 2020-09-08 DIAGNOSIS — Z6821 Body mass index (BMI) 21.0-21.9, adult: Secondary | ICD-10-CM | POA: Diagnosis not present

## 2020-09-08 DIAGNOSIS — M47816 Spondylosis without myelopathy or radiculopathy, lumbar region: Secondary | ICD-10-CM | POA: Diagnosis not present

## 2020-09-08 DIAGNOSIS — M4327 Fusion of spine, lumbosacral region: Secondary | ICD-10-CM | POA: Diagnosis not present

## 2020-09-08 MED ORDER — DICLOFENAC SODIUM 1 % EX GEL: 2.0000 g | Freq: Four times a day (QID) | CUTANEOUS | 2 refills | Status: DC

## 2020-09-08 NOTE — Patient Instructions (Signed)
For instructions on how to put on your Tri-Lock Ankle Brace, please visit [email or web address] 

## 2020-09-14 DIAGNOSIS — G894 Chronic pain syndrome: Secondary | ICD-10-CM | POA: Diagnosis not present

## 2020-09-14 DIAGNOSIS — M47816 Spondylosis without myelopathy or radiculopathy, lumbar region: Secondary | ICD-10-CM | POA: Diagnosis not present

## 2020-09-14 DIAGNOSIS — M6281 Muscle weakness (generalized): Secondary | ICD-10-CM | POA: Diagnosis not present

## 2020-09-14 NOTE — Progress Notes (Signed)
Subjective: 75 year old female presents the office today for concerns of pain to her left foot.  She points to the top of the foot the fifth majority discomfort.  This is been ongoing issue and she denies any recent injury or trauma.  She said no recent treatment.  I last saw her in March 2021 for discomfort in the medial aspect left foot, arthritis. Denies any systemic complaints such as fevers, chills, nausea, vomiting. No acute changes since last appointment, and no other complaints at this time.   Objective: AAO x3, NAD DP/PT pulses palpable bilaterally, CRT less than 3 seconds There is dorsal spurring present of the midfoot on the left foot.  There is no significant edema, erythema there is no soft tissues identified.  Flexor, extensor tendons appear to be intact.  MMT 5/5.  Majority tenderness on the second tarsal cuneiform joint area. No pain with calf compression, swelling, warmth, erythema  Assessment: Left foot arthritis  Plan: -All treatment options discussed with the patient including all alternatives, risks, complications.  -X-rays obtained reviewed.  Arthritic changes present the midfoot most notably the second tarsal cuneiform joint. -Discussed steroid injection but she wishes to hold off.  Discussed topical anti-inflammatory gel.  Discussed shoe modifications and stiffer soled shoes.  Discussed orthotics as well. -She has a Tri-Lock brace at home which is helpful when she wears it.  We showed her how to put this on. -Patient encouraged to call the office with any questions, concerns, change in symptoms.   Vivi Barrack DPM

## 2020-09-16 DIAGNOSIS — G894 Chronic pain syndrome: Secondary | ICD-10-CM | POA: Diagnosis not present

## 2020-09-16 DIAGNOSIS — M47816 Spondylosis without myelopathy or radiculopathy, lumbar region: Secondary | ICD-10-CM | POA: Diagnosis not present

## 2020-09-16 DIAGNOSIS — M6281 Muscle weakness (generalized): Secondary | ICD-10-CM | POA: Diagnosis not present

## 2020-09-19 DIAGNOSIS — M47816 Spondylosis without myelopathy or radiculopathy, lumbar region: Secondary | ICD-10-CM | POA: Diagnosis not present

## 2020-09-20 DIAGNOSIS — G894 Chronic pain syndrome: Secondary | ICD-10-CM | POA: Diagnosis not present

## 2020-09-20 DIAGNOSIS — M6281 Muscle weakness (generalized): Secondary | ICD-10-CM | POA: Diagnosis not present

## 2020-09-20 DIAGNOSIS — M47816 Spondylosis without myelopathy or radiculopathy, lumbar region: Secondary | ICD-10-CM | POA: Diagnosis not present

## 2020-09-22 DIAGNOSIS — M47816 Spondylosis without myelopathy or radiculopathy, lumbar region: Secondary | ICD-10-CM | POA: Diagnosis not present

## 2020-09-22 DIAGNOSIS — G894 Chronic pain syndrome: Secondary | ICD-10-CM | POA: Diagnosis not present

## 2020-09-22 DIAGNOSIS — M6281 Muscle weakness (generalized): Secondary | ICD-10-CM | POA: Diagnosis not present

## 2020-10-12 DIAGNOSIS — M4327 Fusion of spine, lumbosacral region: Secondary | ICD-10-CM | POA: Diagnosis not present

## 2020-10-12 DIAGNOSIS — M4326 Fusion of spine, lumbar region: Secondary | ICD-10-CM | POA: Diagnosis not present

## 2020-10-12 DIAGNOSIS — M461 Sacroiliitis, not elsewhere classified: Secondary | ICD-10-CM | POA: Diagnosis not present

## 2020-10-12 DIAGNOSIS — M47816 Spondylosis without myelopathy or radiculopathy, lumbar region: Secondary | ICD-10-CM | POA: Diagnosis not present

## 2020-10-18 DIAGNOSIS — M47816 Spondylosis without myelopathy or radiculopathy, lumbar region: Secondary | ICD-10-CM | POA: Diagnosis not present

## 2020-11-28 DIAGNOSIS — M47816 Spondylosis without myelopathy or radiculopathy, lumbar region: Secondary | ICD-10-CM | POA: Diagnosis not present

## 2020-12-21 DIAGNOSIS — M4327 Fusion of spine, lumbosacral region: Secondary | ICD-10-CM | POA: Diagnosis not present

## 2020-12-21 DIAGNOSIS — M47816 Spondylosis without myelopathy or radiculopathy, lumbar region: Secondary | ICD-10-CM | POA: Diagnosis not present

## 2020-12-28 ENCOUNTER — Other Ambulatory Visit: Payer: Self-pay | Admitting: Rehabilitation

## 2020-12-28 DIAGNOSIS — M546 Pain in thoracic spine: Secondary | ICD-10-CM

## 2020-12-28 DIAGNOSIS — M47816 Spondylosis without myelopathy or radiculopathy, lumbar region: Secondary | ICD-10-CM

## 2020-12-29 ENCOUNTER — Telehealth: Payer: Self-pay

## 2020-12-29 NOTE — Telephone Encounter (Signed)
Spoke with patient for [place] to screen her medications before getting her scheduled for a lumbar myelogram.  She stated an understanding that she will be here approximately two hours and will need a driver. She has no medications to hold, as she does not take Sertraline anymore.

## 2021-01-06 ENCOUNTER — Ambulatory Visit
Admission: RE | Admit: 2021-01-06 | Discharge: 2021-01-06 | Disposition: A | Discharge status: Home / Self Care | Payer: Medicare PPO | Source: Ambulatory Visit | Attending: Rehabilitation | Admitting: Rehabilitation

## 2021-01-06 ENCOUNTER — Other Ambulatory Visit: Payer: Self-pay

## 2021-01-06 DIAGNOSIS — M4326 Fusion of spine, lumbar region: Secondary | ICD-10-CM | POA: Diagnosis not present

## 2021-01-06 DIAGNOSIS — M48061 Spinal stenosis, lumbar region without neurogenic claudication: Secondary | ICD-10-CM | POA: Diagnosis not present

## 2021-01-06 DIAGNOSIS — M47816 Spondylosis without myelopathy or radiculopathy, lumbar region: Secondary | ICD-10-CM

## 2021-01-06 DIAGNOSIS — M546 Pain in thoracic spine: Secondary | ICD-10-CM

## 2021-01-06 DIAGNOSIS — I2510 Atherosclerotic heart disease of native coronary artery without angina pectoris: Secondary | ICD-10-CM | POA: Diagnosis not present

## 2021-01-06 DIAGNOSIS — M545 Low back pain, unspecified: Secondary | ICD-10-CM | POA: Diagnosis not present

## 2021-01-06 MED ORDER — IOPAMIDOL (ISOVUE-M 300) INJECTION 61%
10.0000 mL | Freq: Once | INTRAMUSCULAR | Status: AC | PRN
  Administered 2021-01-06: 10 mL via INTRATHECAL

## 2021-01-06 NOTE — Progress Notes (Signed)
Valium not given to pt prior to myelogram procedure as she reports she took 10 mg of valium prior to arriving at our office.

## 2021-01-06 NOTE — Discharge Instructions (Signed)
Myelogram Discharge Instructions  1. Go home and rest quietly as needed. You may resume normal activities; however, do not exert yourself strongly or do any heavy lifting today and tomorrow.   2. DO NOT drive today.    3. You may resume your normal diet and medications unless otherwise indicated. Drink lots of extra fluids today and tomorrow.   4. The incidence of headache, nausea, or vomiting is about 5% (one in 20 patients).  If you develop a headache, lie flat for 24 hours and drink plenty of fluids until the headache goes away.  Caffeinated beverages may be helpful. If when you get up you still have a headache when standing, go back to bed and force fluids for another 24 hours.   5. If you develop severe nausea and vomiting or a headache that does not go away with the flat bedrest after 48 hours, please call [phone].   6. Call your physician for a follow-up appointment.  The results of your myelogram will be sent directly to your physician by the following day.  7. If you have any questions or if complications develop after you arrive home, please call [phone].  Discharge instructions have been explained to the patient.  The patient, or the person responsible for the patient, fully understands these instructions.   Thank you for visiting our office today.  

## 2021-01-16 ENCOUNTER — Ambulatory Visit (INDEPENDENT_AMBULATORY_CARE_PROVIDER_SITE_OTHER): Payer: Medicare PPO

## 2021-01-16 ENCOUNTER — Other Ambulatory Visit: Payer: Self-pay

## 2021-01-16 ENCOUNTER — Ambulatory Visit: Payer: Medicare PPO | Admitting: Podiatry

## 2021-01-16 DIAGNOSIS — M19072 Primary osteoarthritis, left ankle and foot: Secondary | ICD-10-CM

## 2021-01-16 DIAGNOSIS — M19079 Primary osteoarthritis, unspecified ankle and foot: Secondary | ICD-10-CM

## 2021-01-16 DIAGNOSIS — M778 Other enthesopathies, not elsewhere classified: Secondary | ICD-10-CM

## 2021-01-16 DIAGNOSIS — M79672 Pain in left foot: Secondary | ICD-10-CM

## 2021-01-16 MED ORDER — MELOXICAM 7.5 MG PO TABS: 7.5000 mg | ORAL_TABLET | Freq: Every day | ORAL | 0 refills | Status: DC

## 2021-01-18 DIAGNOSIS — M4327 Fusion of spine, lumbosacral region: Secondary | ICD-10-CM | POA: Diagnosis not present

## 2021-01-18 DIAGNOSIS — M47816 Spondylosis without myelopathy or radiculopathy, lumbar region: Secondary | ICD-10-CM | POA: Diagnosis not present

## 2021-01-18 NOTE — Progress Notes (Signed)
Subjective: 75 year old female presents the office today with her husband for concerns of left foot pain.  She has noticed a knot on the top of her foot which is been ongoing for greater than 1 year.  She states the pain is becoming more consistent.  Denies any recent injury or trauma.  She states that discomfort starts to move around as well instructed some heel pain. Denies any systemic complaints such as fevers, chills, nausea, vomiting. No acute changes since last appointment, and no other complaints at this time.   Objective: AAO x3, NAD DP/PT pulses palpable bilaterally, CRT less than 3 seconds Dorsal spurring present the dorsal medial aspect the midfoot as well as the medial aspect along the navicular area.  Dorsally associated majority discomfort.  There is decreased medial arch upon weightbearing.  Trace edema to the medial aspect of the foot on the area of the bone spur but no other areas of edema.  There is no erythema or warmth.  No pain with Achilles tendon.  Subjectively get some discomfort of the lateral aspect of the foot and this is coming from compensation.  MMT 5/5. No pain with calf compression, swelling, warmth, erythema  Assessment: Arthritis left foot, capsulitis  Plan: -All treatment options discussed with the patient including all alternatives, risks, complications.  -Repeat x-rays obtained reviewed which reveals arthritic changes present to the foot.  There is no evidence of acute fracture. -I discussion regards with conservative as well as surgical treatment options.  She does not want proceed with surgical intervention.  I dispensed a pair of power steps to see if this will be helpful.  Not consider custom insert.  Discussed wearing stiffer soled shoe.  Offered steroid injection today as well.  She can use Voltaren gel if needed we will prescribe meloxicam to take as needed as well.  Vivi Barrack DPM  -Patient encouraged to call the office with any questions,  concerns, change in symptoms.

## 2021-02-01 DIAGNOSIS — M5416 Radiculopathy, lumbar region: Secondary | ICD-10-CM | POA: Diagnosis not present

## 2021-02-06 DIAGNOSIS — M25562 Pain in left knee: Secondary | ICD-10-CM | POA: Diagnosis not present

## 2021-02-06 DIAGNOSIS — M25561 Pain in right knee: Secondary | ICD-10-CM | POA: Diagnosis not present

## 2021-02-13 DIAGNOSIS — M5416 Radiculopathy, lumbar region: Secondary | ICD-10-CM | POA: Diagnosis not present

## 2021-02-13 DIAGNOSIS — M4327 Fusion of spine, lumbosacral region: Secondary | ICD-10-CM | POA: Diagnosis not present

## 2021-02-13 DIAGNOSIS — M47816 Spondylosis without myelopathy or radiculopathy, lumbar region: Secondary | ICD-10-CM | POA: Diagnosis not present

## 2021-02-14 DIAGNOSIS — H6121 Impacted cerumen, right ear: Secondary | ICD-10-CM | POA: Diagnosis not present

## 2021-02-15 ENCOUNTER — Other Ambulatory Visit: Payer: Self-pay | Admitting: Podiatry

## 2021-02-15 NOTE — Telephone Encounter (Signed)
Please advise 

## 2021-02-20 DIAGNOSIS — M5416 Radiculopathy, lumbar region: Secondary | ICD-10-CM | POA: Diagnosis not present

## 2021-03-16 ENCOUNTER — Ambulatory Visit: Payer: Medicare PPO | Admitting: Podiatry

## 2021-03-27 ENCOUNTER — Ambulatory Visit: Payer: Medicare PPO | Admitting: Podiatry

## 2021-03-27 ENCOUNTER — Encounter: Payer: Self-pay | Admitting: Podiatry

## 2021-03-27 ENCOUNTER — Other Ambulatory Visit: Payer: Self-pay

## 2021-03-27 DIAGNOSIS — M778 Other enthesopathies, not elsewhere classified: Secondary | ICD-10-CM

## 2021-03-27 DIAGNOSIS — M19072 Primary osteoarthritis, left ankle and foot: Secondary | ICD-10-CM | POA: Diagnosis not present

## 2021-03-27 NOTE — Patient Instructions (Signed)
I have ordered a referral to [facility]. If you do not hear for them about scheduling within the next 1 week, or you have any questions please give [place] a call at [phone].

## 2021-03-28 NOTE — Progress Notes (Signed)
Subjective: 75 year old female presents the office today with her husband for concerns of left foot pain.  She still points on the dorsal medial aspect of the foot where she has majority discomfort.  She is also had other joint issues and other arthritis.  She is asking there is any medication she can take for her overall arthritis.  She is never seen a rheumatologist but she is interested in seeing if there is anything else that could be beneficial for her.  No recent injury or trauma any changes.  Objective: AAO x3, NAD DP/PT pulses palpable bilaterally, CRT less than 3 seconds Dorsal spurring present the dorsal medial aspect the midfoot as well as the medial aspect along the navicular area.  This is where she has a localized area of discomfort.  No other areas of discomfort identified.  No other areas of edema, erythema.  Decreased medial arch upon weightbearing.  Flexor, extensor tendons appear to be intact.  MMT 5/5. No pain with calf compression, swelling, warmth, erythema  Assessment: Arthritis left foot, capsulitis  Plan: -All treatment options discussed with the patient including all alternatives, risks, complications.  -Discussed treatment options and we discussed getting MRI but she wants to hold off on the MRI at this time.  We discussed steroid injection was also decided to hold off on this.  Discussed topical creams that she can use as well including Voltaren gel, Biofreeze as well as CBD cream.  -We will refer to [facility] rheumatology for evaluation  Return if symptoms worsen or fail to improve.  Vivi Barrack DPM

## 2021-03-29 DIAGNOSIS — M4726 Other spondylosis with radiculopathy, lumbar region: Secondary | ICD-10-CM | POA: Diagnosis not present

## 2021-03-29 DIAGNOSIS — M4326 Fusion of spine, lumbar region: Secondary | ICD-10-CM | POA: Diagnosis not present

## 2021-03-29 DIAGNOSIS — I10 Essential (primary) hypertension: Secondary | ICD-10-CM | POA: Diagnosis not present

## 2021-03-29 DIAGNOSIS — Z6821 Body mass index (BMI) 21.0-21.9, adult: Secondary | ICD-10-CM | POA: Diagnosis not present

## 2021-04-06 DIAGNOSIS — M1712 Unilateral primary osteoarthritis, left knee: Secondary | ICD-10-CM | POA: Diagnosis not present

## 2021-04-10 DIAGNOSIS — M5451 Vertebrogenic low back pain: Secondary | ICD-10-CM | POA: Diagnosis not present

## 2021-04-12 DIAGNOSIS — M5451 Vertebrogenic low back pain: Secondary | ICD-10-CM | POA: Diagnosis not present

## 2021-04-13 DIAGNOSIS — M1712 Unilateral primary osteoarthritis, left knee: Secondary | ICD-10-CM | POA: Diagnosis not present

## 2021-04-17 DIAGNOSIS — M5451 Vertebrogenic low back pain: Secondary | ICD-10-CM | POA: Diagnosis not present

## 2021-04-19 DIAGNOSIS — M5451 Vertebrogenic low back pain: Secondary | ICD-10-CM | POA: Diagnosis not present

## 2021-04-20 DIAGNOSIS — M1712 Unilateral primary osteoarthritis, left knee: Secondary | ICD-10-CM | POA: Diagnosis not present

## 2021-04-24 DIAGNOSIS — M5451 Vertebrogenic low back pain: Secondary | ICD-10-CM | POA: Diagnosis not present

## 2021-04-27 DIAGNOSIS — M5451 Vertebrogenic low back pain: Secondary | ICD-10-CM | POA: Diagnosis not present

## 2021-05-03 DIAGNOSIS — F419 Anxiety disorder, unspecified: Secondary | ICD-10-CM | POA: Diagnosis not present

## 2021-05-03 DIAGNOSIS — I10 Essential (primary) hypertension: Secondary | ICD-10-CM | POA: Diagnosis not present

## 2021-05-03 DIAGNOSIS — G8929 Other chronic pain: Secondary | ICD-10-CM | POA: Diagnosis not present

## 2021-05-03 DIAGNOSIS — E7800 Pure hypercholesterolemia, unspecified: Secondary | ICD-10-CM | POA: Diagnosis not present

## 2021-05-05 DIAGNOSIS — M5451 Vertebrogenic low back pain: Secondary | ICD-10-CM | POA: Diagnosis not present

## 2021-05-11 DIAGNOSIS — M5451 Vertebrogenic low back pain: Secondary | ICD-10-CM | POA: Diagnosis not present

## 2021-05-19 DIAGNOSIS — M5451 Vertebrogenic low back pain: Secondary | ICD-10-CM | POA: Diagnosis not present

## 2021-05-22 DIAGNOSIS — M5451 Vertebrogenic low back pain: Secondary | ICD-10-CM | POA: Diagnosis not present

## 2021-05-26 DIAGNOSIS — M5451 Vertebrogenic low back pain: Secondary | ICD-10-CM | POA: Diagnosis not present

## 2021-05-30 DIAGNOSIS — M5451 Vertebrogenic low back pain: Secondary | ICD-10-CM | POA: Diagnosis not present

## 2021-06-02 DIAGNOSIS — M5451 Vertebrogenic low back pain: Secondary | ICD-10-CM | POA: Diagnosis not present

## 2021-06-05 DIAGNOSIS — M5451 Vertebrogenic low back pain: Secondary | ICD-10-CM | POA: Diagnosis not present

## 2021-06-09 DIAGNOSIS — M5451 Vertebrogenic low back pain: Secondary | ICD-10-CM | POA: Diagnosis not present

## 2021-06-12 DIAGNOSIS — M5451 Vertebrogenic low back pain: Secondary | ICD-10-CM | POA: Diagnosis not present

## 2021-06-15 DIAGNOSIS — L03019 Cellulitis of unspecified finger: Secondary | ICD-10-CM | POA: Diagnosis not present

## 2021-06-15 DIAGNOSIS — M5451 Vertebrogenic low back pain: Secondary | ICD-10-CM | POA: Diagnosis not present

## 2021-06-15 DIAGNOSIS — Z6822 Body mass index (BMI) 22.0-22.9, adult: Secondary | ICD-10-CM | POA: Diagnosis not present

## 2021-06-15 DIAGNOSIS — E7800 Pure hypercholesterolemia, unspecified: Secondary | ICD-10-CM | POA: Diagnosis not present

## 2021-06-19 DIAGNOSIS — M5451 Vertebrogenic low back pain: Secondary | ICD-10-CM | POA: Diagnosis not present

## 2021-06-21 ENCOUNTER — Emergency Department (HOSPITAL_BASED_OUTPATIENT_CLINIC_OR_DEPARTMENT_OTHER): Payer: Medicare PPO

## 2021-06-21 ENCOUNTER — Other Ambulatory Visit (HOSPITAL_BASED_OUTPATIENT_CLINIC_OR_DEPARTMENT_OTHER): Payer: Self-pay | Admitting: Radiology

## 2021-06-21 ENCOUNTER — Emergency Department (HOSPITAL_BASED_OUTPATIENT_CLINIC_OR_DEPARTMENT_OTHER)
Admission: EM | Admit: 2021-06-21 | Discharge: 2021-06-21 | Disposition: A | Discharge status: Home / Self Care | Payer: Medicare PPO | Attending: Emergency Medicine | Admitting: Emergency Medicine

## 2021-06-21 ENCOUNTER — Encounter (HOSPITAL_BASED_OUTPATIENT_CLINIC_OR_DEPARTMENT_OTHER): Payer: Self-pay | Admitting: Obstetrics and Gynecology

## 2021-06-21 ENCOUNTER — Other Ambulatory Visit: Payer: Self-pay

## 2021-06-21 DIAGNOSIS — I6523 Occlusion and stenosis of bilateral carotid arteries: Secondary | ICD-10-CM | POA: Diagnosis not present

## 2021-06-21 DIAGNOSIS — R519 Headache, unspecified: Secondary | ICD-10-CM

## 2021-06-21 DIAGNOSIS — Z87891 Personal history of nicotine dependence: Secondary | ICD-10-CM | POA: Diagnosis not present

## 2021-06-21 DIAGNOSIS — G44209 Tension-type headache, unspecified, not intractable: Secondary | ICD-10-CM | POA: Insufficient documentation

## 2021-06-21 DIAGNOSIS — I160 Hypertensive urgency: Secondary | ICD-10-CM | POA: Diagnosis not present

## 2021-06-21 DIAGNOSIS — I10 Essential (primary) hypertension: Secondary | ICD-10-CM | POA: Diagnosis not present

## 2021-06-21 LAB — URINALYSIS, ROUTINE W REFLEX MICROSCOPIC
Bilirubin Urine: NEGATIVE
Glucose, UA: NEGATIVE mg/dL
Hgb urine dipstick: NEGATIVE
Ketones, ur: NEGATIVE mg/dL
Leukocytes,Ua: NEGATIVE
Nitrite: NEGATIVE
Protein, ur: 30 mg/dL — AB
Specific Gravity, Urine: <1.005 — ABNORMAL LOW (ref 1.005–1.030)
pH: 6.5 (ref 5.0–8.0)

## 2021-06-21 LAB — BASIC METABOLIC PANEL
Anion gap: 10 (ref 5–15)
BUN: 15 mg/dL (ref 8–23)
CO2: 28 mmol/L (ref 22–32)
Calcium: 9.9 mg/dL (ref 8.9–10.3)
Chloride: 100 mmol/L (ref 98–111)
Creatinine, Ser: 0.78 mg/dL (ref 0.44–1.00)
GFR, Estimated: >60 mL/min (ref >60)
Glucose, Bld: 126 mg/dL — ABNORMAL HIGH (ref 70–99)
Potassium: 3.9 mmol/L (ref 3.5–5.1)
Sodium: 138 mmol/L (ref 135–145)

## 2021-06-21 LAB — CBC
HCT: 48.4 % — ABNORMAL HIGH (ref 36.0–46.0)
Hemoglobin: 15.9 g/dL — ABNORMAL HIGH (ref 12.0–15.0)
MCH: 30.9 pg (ref 26.0–34.0)
MCHC: 32.9 g/dL (ref 30.0–36.0)
MCV: 94 fL (ref 80.0–100.0)
Platelets: 221 10*3/uL (ref 150–400)
RBC: 5.15 MIL/uL — ABNORMAL HIGH (ref 3.87–5.11)
RDW: 12.3 % (ref 11.5–15.5)
WBC: 4.9 10*3/uL (ref 4.0–10.5)
nRBC: 0 % (ref 0.0–0.2)

## 2021-06-21 LAB — TROPONIN I (HIGH SENSITIVITY)
Troponin I (High Sensitivity): 4 ng/L (ref <18)
Troponin I (High Sensitivity): 5 ng/L (ref <18)

## 2021-06-21 LAB — C-REACTIVE PROTEIN: CRP: 0.5 mg/dL (ref <1.0)

## 2021-06-21 LAB — SEDIMENTATION RATE: Sed Rate: 5 mm/hr (ref 0–22)

## 2021-06-21 MED ORDER — LABETALOL HCL 5 MG/ML IV SOLN
5.0000 mg | Freq: Once | INTRAVENOUS | Status: AC
  Administered 2021-06-21: 5 mg via INTRAVENOUS
  Filled 2021-06-21: qty 4

## 2021-06-21 MED ORDER — NITROGLYCERIN 2 % TD OINT
1.0000 [in_us] | TOPICAL_OINTMENT | Freq: Once | TRANSDERMAL | Status: AC
  Administered 2021-06-21: 1 [in_us] via TOPICAL
  Filled 2021-06-21: qty 1

## 2021-06-21 MED ORDER — METOPROLOL TARTRATE 25 MG PO TABS: 25.0000 mg | ORAL_TABLET | Freq: Once | ORAL | Status: DC

## 2021-06-21 MED ORDER — IOHEXOL 350 MG/ML SOLN
75.0000 mL | Freq: Once | INTRAVENOUS | Status: AC | PRN
  Administered 2021-06-21: 75 mL via INTRAVENOUS

## 2021-06-21 MED ORDER — LABETALOL HCL 5 MG/ML IV SOLN: 10.0000 mg | Freq: Once | INTRAVENOUS | Status: DC

## 2021-06-21 MED ORDER — METOPROLOL TARTRATE 25 MG PO TABS
25.0000 mg | ORAL_TABLET | Freq: Once | ORAL | Status: AC
  Administered 2021-06-21: 25 mg via ORAL
  Filled 2021-06-21: qty 1

## 2021-06-21 NOTE — ED Notes (Signed)
[place] to see   Karrie Meres, PA-C 06/21/21 1708

## 2021-06-21 NOTE — ED Provider Notes (Signed)
[facility] EMERGENCY DEPT Provider Note   CSN: [id number] Arrival date & time: 06/21/21  1022     History Chief Complaint  Patient presents with   Neck Pain   Dizziness    Kimberly [name] is a 75 y.o. female.  HPI  75 year old female with a history of anxiety, arthritis, depression, GERD, hypertension, who presents the emergency department today for evaluation of headache and dizziness.  Patient states she started having headache around midnight last night.  She reports that she just did not feel right and she had some associated dizziness as well.  She describes this mainly feeling off balance when she is up ambulating.  She denies a room spinning sensation or lightheadedness.  She has not had any blurred vision or double vision.  She denies any chest pain but she has had some intermittent jaw pain few times over the last week.  She denies any numbness or weakness.  Denies any recent fevers or URI symptoms.  Past Medical History:  Diagnosis Date   Anxiety    Arthritis 07/06/11   right thumb   Depression    GERD (gastroesophageal reflux disease)    Hypertension    Trigger finger of right hand    ring finger    Patient Active Problem List   Diagnosis Date Noted   Excessive cerumen in both ear canals 10/21/2019   Lumbar discogenic pain syndrome 06/04/2019   Trigger ring finger of right hand 09/01/2018   DDD (degenerative disc disease), lumbar 04/01/2018   Low back pain 04/01/2018   Lumbar radiculopathy 04/01/2018   Lumbar spinal stenosis 04/01/2018   Lumbar spondylosis 04/01/2018   Chronic eczematous otitis externa of both ears 05/27/2017   Sensorineural hearing loss (SNHL), bilateral 05/27/2017   Chest pain 05/20/2016   Depression 05/20/2016   Hyperlipidemia 07/08/2011   Chest pain 07/06/2011   HTN (hypertension) 07/06/2011    Past Surgical History:  Procedure Laterality Date   ABDOMINAL HYSTERECTOMY  approx. 2008   BACK SURGERY  2013   lumb  fusion   BACK SURGERY  10/2015   BREAST EXCISIONAL BIOPSY Left    benign   BREAST LUMPECTOMY  appro. 1990's   left - benign   COLONOSCOPY     KNEE ARTHROSCOPY     both scopes   SHOULDER ARTHROSCOPY  5/15   right   SHOULDER ARTHROSCOPY WITH ROTATOR CUFF REPAIR AND SUBACROMIAL DECOMPRESSION Right 07/30/2014   Procedure: RIGHT SHOULDER ARTHROSCOPY WITH REVISION ACROMIOPLASTY, DEBRIDEMENT AND REVISION OPEN ROTATOR CUFF REPAIR   ;  Surgeon: Thera Flake., MD;  Location: [facility];  Service: Orthopedics;  Laterality: Right;   TONSILLECTOMY     TRIGGER FINGER RELEASE Right 01/08/2019   Procedure: RIGHT RING FINGER RELEASE TRIGGER FINGER/A-1 PULLEY;  Surgeon: Cindee Salt, MD;  Location:  SURGERY CENTER;  Service: Orthopedics;  Laterality: Right;  FAB     OB History     Gravida      Para      Term      Preterm      AB      Living  2      SAB      IAB      Ectopic      Multiple      Live Births              Family History  Problem Relation Age of Onset   Cancer Brother    Coronary artery disease  Father     Social History   Tobacco Use   Smoking status: Former    Years: 15.00    Types: Cigarettes    Quit date: 09/05/1991    Years since quitting: 29.8   Smokeless tobacco: Never  Vaping Use   Vaping Use: Never used  Substance Use Topics   Alcohol use: Yes    Comment: 2 cocktails per night   Drug use: No    Home Medications Prior to Admission medications   Medication Sig Start Date End Date Taking? Authorizing Provider  atorvastatin (LIPITOR) 20 MG tablet  09/28/19   [provider]  cholecalciferol (VITAMIN D) 1000 UNITS tablet Take 1,000 Units by mouth daily.   Patient not taking: Reported on 12/29/2020    [provider]  HYDROcodone-acetaminophen (NORCO/VICODIN) 5-325 MG tablet Take 1-2 tablets by mouth every 6 (six) hours as needed for moderate pain.    [provider]  meloxicam (MOBIC) 7.5 MG tablet  TAKE 1 TABLET BY MOUTH EVERY DAY 02/15/21   Vivi Barrack, DPM  methocarbamol (ROBAXIN) 750 MG tablet Take by mouth. 11/09/15   [provider]  metoprolol tartrate (LOPRESSOR) 25 MG tablet Take 1 tablet (25 mg total) by mouth 2 (two) times daily. 07/08/11 01/02/19  Rodolph Bong, MD  sertraline (ZOLOFT) 100 MG tablet  10/08/19   [provider]    Allergies    Cyclobenzaprine, Flexeril [cyclobenzaprine hcl], Metaxalone, Skelaxin, Levofloxacin, Penicillins, Sulfamethoxazole-trimethoprim, and Tizanidine hcl  Review of Systems   Review of Systems  Constitutional:  Negative for fever.  HENT:  Negative for ear pain and sore throat.        Jaw pain  Eyes:  Negative for visual disturbance.  Respiratory:  Negative for cough and shortness of breath.   Cardiovascular:  Negative for chest pain.  Gastrointestinal:  Negative for abdominal pain, constipation, diarrhea, nausea and vomiting.  Genitourinary:  Negative for dysuria and hematuria.  Musculoskeletal:  Negative for back pain.  Skin:  Negative for color change and rash.  Neurological:  Positive for dizziness and headaches. Negative for speech difficulty, weakness and numbness.  All other systems reviewed and are negative.  Physical Exam Updated Vital Signs BP (!) 165/83 (BP Location: Right Arm)   Pulse 65   Temp 98.3 F (36.8 C) (Oral)   Resp (!) 22   SpO2 96%   Physical Exam Vitals and nursing note reviewed.  Constitutional:      General: She is not in acute distress.    Appearance: She is well-developed.  HENT:     Head: Normocephalic and atraumatic.  Eyes:     Conjunctiva/sclera: Conjunctivae normal.  Cardiovascular:     Rate and Rhythm: Normal rate and regular rhythm.     Heart sounds: Normal heart sounds. No murmur heard. Pulmonary:     Effort: Pulmonary effort is normal. No respiratory distress.     Breath sounds: Normal breath sounds. No wheezing, rhonchi or rales.  Abdominal:     General:  Bowel sounds are normal.     Palpations: Abdomen is soft.     Tenderness: There is no abdominal tenderness. There is no guarding or rebound.  Musculoskeletal:     Cervical back: Neck supple.  Skin:    General: Skin is warm and dry.  Neurological:     Mental Status: She is alert.     Comments: Mental Status:  Alert, thought content appropriate, able to give a coherent history. Speech fluent without evidence of  aphasia. Able to follow 2 step commands without difficulty.  Cranial Nerves:  II: pupils equal, round, reactive to light III,IV, VI: ptosis not present, extra-ocular motions intact bilaterally  V,VII: smile symmetric, facial light touch sensation equal VIII: hearing grossly normal to voice  X: uvula elevates symmetrically  XI: bilateral shoulder shrug symmetric and strong XII: midline tongue extension without fassiculations Motor:  Normal tone. 5/5 strength of BUE and BLE major muscle groups including strong and equal grip strength and dorsiflexion/plantar flexion Sensory: slightly decreased sensation to the lue/lle Cerebellar: normal finger-to-nose with bilateral upper extremities, normal heel to shin Gait: normal gait and balance.      ED Results / Procedures / Treatments   Labs (all labs ordered are listed, but only abnormal results are displayed) Labs Reviewed  BASIC METABOLIC PANEL - Abnormal; Notable for the following components:      Result Value   Glucose, Bld 126 (*)    All other components within normal limits  CBC - Abnormal; Notable for the following components:   RBC 5.15 (*)    Hemoglobin 15.9 (*)    HCT 48.4 (*)    All other components within normal limits  URINALYSIS, ROUTINE W REFLEX MICROSCOPIC - Abnormal; Notable for the following components:   Color, Urine COLORLESS (*)    Specific Gravity, Urine <1.005 (*)    Protein, ur 30 (*)    All other components within normal limits  SEDIMENTATION RATE  C-REACTIVE PROTEIN  TROPONIN I (HIGH SENSITIVITY)   TROPONIN I (HIGH SENSITIVITY)    EKG EKG Interpretation  Date/Time:  Wednesday June 21 2021 11:11:26 EDT Ventricular Rate:  92 PR Interval:  180 QRS Duration: 68 QT Interval:  376 QTC Calculation: 464 R Axis:   39 Text Interpretation: Sinus rhythm with Premature atrial complexes Cannot rule out Anterior infarct , age undetermined  similar to Sept 2017 Confirmed by Pricilla Loveless [phone]) on 06/21/2021 11:14:16 AM  Radiology CT ANGIO HEAD NECK W WO CM  Result Date: 06/21/2021 CLINICAL DATA:  Dizziness, non-specific EXAM: CT ANGIOGRAPHY HEAD AND NECK TECHNIQUE: Multidetector CT imaging of the head and neck was performed using the standard protocol during bolus administration of intravenous contrast. Multiplanar CT image reconstructions and MIPs were obtained to evaluate the vascular anatomy. Carotid stenosis measurements (when applicable) are obtained utilizing NASCET criteria, using the distal internal carotid diameter as the denominator. CONTRAST:  8mL OMNIPAQUE IOHEXOL 350 MG/ML SOLN COMPARISON:  None. FINDINGS: CTA NECK Aortic arch: Mild plaque along the arch and patent great vessel origins. Right carotid system: Patent. Mild calcified plaque at the bifurcation. No stenosis. Retropharyngeal course. Left carotid system: Patent.  No stenosis.  Retropharyngeal course. Vertebral arteries: Patent. Left vertebral artery is dominant. No stenosis. Skeleton: Degenerative changes of the cervical spine. Other neck: 5 mm low-density lesion of the left submandibular gland. Upper chest: No apical lung mass. Review of the MIP images confirms the above findings CTA HEAD Anterior circulation: Intracranial internal carotid arteries are patent. Mild calcified plaque is present. Anterior and middle cerebral arteries are patent. Anterior communicating artery is present. Posterior circulation: Intracranial left vertebral artery is patent. The right vertebral artery is patent but diminutive after PICA origin  with possible superimposed stenosis. Basilar artery is patent. Major cerebellar artery origins are patent. Bilateral posterior communicating arteries are present. Posterior cerebral arteries are patent. Venous sinuses: Patent as allowed by contrast bolus timing. Review of the MIP images confirms the above findings IMPRESSION: No large vessel occlusion. No hemodynamically significant stenosis  or evidence of dissection in the neck. No proximal intracranial vessel occlusion. Diminutive non dominant right vertebral artery after PICA origin with possible superimposed stenosis. 5 mm low-density lesion of the left submandibular gland (series 4, image 52). Suggest nonemergent ENT evaluation. Electronically Signed   By: Guadlupe Spanish M.D.   On: 06/21/2021 16:27   CT Head Wo Contrast  Result Date: 06/21/2021 CLINICAL DATA:  Headache EXAM: CT HEAD WITHOUT CONTRAST TECHNIQUE: Contiguous axial images were obtained from the base of the skull through the vertex without intravenous contrast. COMPARISON:  None. FINDINGS: Brain: There are no signs of bleeding within the cranium. Cortical sulci are prominent. The decreased density in periventricular and subcortical white matter. Vascular: There are scattered arterial calcifications Skull: Unremarkable Sinuses/Orbits: Unremarkable Other: None IMPRESSION: There are no signs of bleeding within the perineum. There is no high report focal mass effect. Atrophy. Small vessel disease. Reading location: [place], [place]. Electronically Signed   By: Ernie Avena M.D.   On: 06/21/2021 12:01    Procedures Procedures   Medications Ordered in ED Medications  metoprolol tartrate (LOPRESSOR) tablet 25 mg (25 mg Oral Given 06/21/21 1530)  labetalol (NORMODYNE) injection 5 mg (5 mg Intravenous Given 06/21/21 1520)  iohexol (OMNIPAQUE) 350 MG/ML injection 75 mL (75 mLs Intravenous Contrast Given 06/21/21 1549)  nitroGLYCERIN (NITROGLYN) 2 % ointment 1 inch (1 inch Topical Given  06/21/21 1645)    ED Course  I have reviewed the triage vital signs and the nursing notes.  Pertinent labs & imaging results that were available during my care of the patient were reviewed by me and considered in my medical decision making ([name] chart for details).    MDM Rules/Calculators/A&P                          75 y/o female presenting for headache and dizziness. Noted to be significantly hypertensive   Reviewed/interpreted labs  CBC with mildly elevated hgb, doubt of clinical significance, no leukocytosis BMP unremarkable UA with proteinuria, otherwise wnl Trop neg, x2 Sed rate negative CRP pending at the time of dispo  EKG - Sinus rhythm with Premature atrial complexes Cannot rule out Anterior infarct , age undetermined  similar to Sept 2017   Reviewed/interpreted imaging CT head - There are no signs of bleeding within the cranium. There is no high report focal mass effect. Atrophy. Small vessel disease. Reading location:  CTA head/neck - No large vessel occlusion. No hemodynamically significant stenosis or evidence of dissection in the neck. No proximal intracranial vessel occlusion. Diminutive non dominant right vertebral artery after PICA origin with possible superimposed stenosis. 5 mm low-density lesion of the left submandibular gland (series 4, image 52). Suggest nonemergent ENT evaluation.  Patient was made aware of incidental findings and need for follow-up.  Her blood pressure was treated in the emergency department and she had significant improvement in her blood pressures.  With improvement of blood pressure she also had resolution of her sensory changes and a significant improvement in her headache.  6:31 PM CONSULT with Dr. Selina Cooley with neurology who does not feel that it is necessary to pursue TIA work-up for the patient given that her symptoms are likely related to her blood pressure.  Patient was given option for admission versus discharge with close PCP  follow-up and increasing in her blood pressure medications.  She ultimately would like to be discharged as she think she can follow-up closely with her PCP and she  feels much better after treatment in the ED.  She states that she is currently taking 12.5 mg of metoprolol.  I advised her to double this dose and she will need to call her PCP tomorrow for further recommendations for blood pressure medication titration.  She is advised to keep a monitor of her blood pressures at home to bring to PCP appointment.  She is advised that if she has any new or worsening symptoms she needs to return immediately to the emergency department.  She voices understanding of the plan and is in agreement.  All questions answered.  Patient stable for discharge.  Patient was seen in conjunction with supervising physician, Dr. Durwin Nora who personally evaluated the patient and is in agreement with the plan.   Final Clinical Impression(s) / ED Diagnoses Final diagnoses:  Nonintractable headache, unspecified chronicity pattern, unspecified headache type  Hypertensive urgency    Rx / DC Orders ED Discharge Orders     None        Rayne Du 06/21/21 2005    Gloris Manchester, MD 06/22/21 [phone]

## 2021-06-21 NOTE — ED Triage Notes (Signed)
Patient presents to the ER for neck pain, dizziness, pain in her jaw, and headache. Patient denies history of RA or Polymyalgia Rheumatica. Patient is able to raise arms above her head. Denies chest pain or Shortness of breath. Patient reports arthritis in her spine.

## 2021-06-21 NOTE — Discharge Instructions (Addendum)
You will need to take a full dose of the 25 mg metoprolol  Keep a log of your blood pressures at home  You were noted to have a lesion near your left submandibular gland. You will need to follow up with the ear nose and throat doctor about this as an outpatient  Call your doctor tomorrow to schedule an appointment for follow in regards to your blood pressure   Please return to the emergency department for any new or worsening symptoms.

## 2021-06-21 NOTE — ED Notes (Signed)
Pt's IV removed, per Fleet Contras - RN.

## 2021-06-26 ENCOUNTER — Other Ambulatory Visit: Payer: Self-pay | Admitting: Family Medicine

## 2021-06-26 DIAGNOSIS — G8929 Other chronic pain: Secondary | ICD-10-CM | POA: Diagnosis not present

## 2021-06-26 DIAGNOSIS — I10 Essential (primary) hypertension: Secondary | ICD-10-CM | POA: Diagnosis not present

## 2021-06-26 DIAGNOSIS — E7800 Pure hypercholesterolemia, unspecified: Secondary | ICD-10-CM | POA: Diagnosis not present

## 2021-06-26 DIAGNOSIS — R6884 Jaw pain: Secondary | ICD-10-CM | POA: Diagnosis not present

## 2021-06-26 DIAGNOSIS — Z1231 Encounter for screening mammogram for malignant neoplasm of breast: Secondary | ICD-10-CM

## 2021-06-28 DIAGNOSIS — M545 Low back pain, unspecified: Secondary | ICD-10-CM | POA: Diagnosis not present

## 2021-06-28 DIAGNOSIS — M791 Myalgia, unspecified site: Secondary | ICD-10-CM | POA: Diagnosis not present

## 2021-06-28 DIAGNOSIS — M4326 Fusion of spine, lumbar region: Secondary | ICD-10-CM | POA: Diagnosis not present

## 2021-06-28 DIAGNOSIS — M4726 Other spondylosis with radiculopathy, lumbar region: Secondary | ICD-10-CM | POA: Diagnosis not present

## 2021-06-29 DIAGNOSIS — M5451 Vertebrogenic low back pain: Secondary | ICD-10-CM | POA: Diagnosis not present

## 2021-06-29 DIAGNOSIS — G894 Chronic pain syndrome: Secondary | ICD-10-CM | POA: Diagnosis not present

## 2021-06-29 DIAGNOSIS — Z79891 Long term (current) use of opiate analgesic: Secondary | ICD-10-CM | POA: Diagnosis not present

## 2021-06-29 DIAGNOSIS — Z79899 Other long term (current) drug therapy: Secondary | ICD-10-CM | POA: Diagnosis not present

## 2021-07-04 DIAGNOSIS — M5451 Vertebrogenic low back pain: Secondary | ICD-10-CM | POA: Diagnosis not present

## 2021-07-05 DIAGNOSIS — M5451 Vertebrogenic low back pain: Secondary | ICD-10-CM | POA: Diagnosis not present

## 2021-07-10 DIAGNOSIS — M5451 Vertebrogenic low back pain: Secondary | ICD-10-CM | POA: Diagnosis not present

## 2021-07-14 DIAGNOSIS — M5451 Vertebrogenic low back pain: Secondary | ICD-10-CM | POA: Diagnosis not present

## 2021-07-17 DIAGNOSIS — M5451 Vertebrogenic low back pain: Secondary | ICD-10-CM | POA: Diagnosis not present

## 2021-07-18 DIAGNOSIS — K118 Other diseases of salivary glands: Secondary | ICD-10-CM | POA: Diagnosis not present

## 2021-07-18 DIAGNOSIS — Z0000 Encounter for general adult medical examination without abnormal findings: Secondary | ICD-10-CM | POA: Diagnosis not present

## 2021-07-18 DIAGNOSIS — H8110 Benign paroxysmal vertigo, unspecified ear: Secondary | ICD-10-CM | POA: Diagnosis not present

## 2021-07-18 DIAGNOSIS — E7800 Pure hypercholesterolemia, unspecified: Secondary | ICD-10-CM | POA: Diagnosis not present

## 2021-07-18 DIAGNOSIS — I10 Essential (primary) hypertension: Secondary | ICD-10-CM | POA: Diagnosis not present

## 2021-07-18 DIAGNOSIS — F419 Anxiety disorder, unspecified: Secondary | ICD-10-CM | POA: Diagnosis not present

## 2021-07-19 DIAGNOSIS — M25562 Pain in left knee: Secondary | ICD-10-CM | POA: Diagnosis not present

## 2021-07-24 DIAGNOSIS — M5451 Vertebrogenic low back pain: Secondary | ICD-10-CM | POA: Diagnosis not present

## 2021-07-28 ENCOUNTER — Ambulatory Visit: Payer: Medicare PPO

## 2021-08-02 DIAGNOSIS — M5451 Vertebrogenic low back pain: Secondary | ICD-10-CM | POA: Diagnosis not present

## 2021-08-03 DIAGNOSIS — M25562 Pain in left knee: Secondary | ICD-10-CM | POA: Diagnosis not present

## 2021-08-04 ENCOUNTER — Ambulatory Visit: Payer: Medicare PPO

## 2021-08-08 DIAGNOSIS — M5451 Vertebrogenic low back pain: Secondary | ICD-10-CM | POA: Diagnosis not present

## 2021-08-11 DIAGNOSIS — S83232D Complex tear of medial meniscus, current injury, left knee, subsequent encounter: Secondary | ICD-10-CM | POA: Diagnosis not present

## 2021-08-11 DIAGNOSIS — S83272D Complex tear of lateral meniscus, current injury, left knee, subsequent encounter: Secondary | ICD-10-CM | POA: Diagnosis not present

## 2021-08-16 DIAGNOSIS — M5451 Vertebrogenic low back pain: Secondary | ICD-10-CM | POA: Diagnosis not present

## 2021-08-29 DIAGNOSIS — M5451 Vertebrogenic low back pain: Secondary | ICD-10-CM | POA: Diagnosis not present

## 2021-08-31 DIAGNOSIS — R220 Localized swelling, mass and lump, head: Secondary | ICD-10-CM | POA: Diagnosis not present

## 2021-08-31 DIAGNOSIS — Z87891 Personal history of nicotine dependence: Secondary | ICD-10-CM | POA: Diagnosis not present

## 2021-08-31 DIAGNOSIS — R42 Dizziness and giddiness: Secondary | ICD-10-CM | POA: Diagnosis not present

## 2021-08-31 DIAGNOSIS — H903 Sensorineural hearing loss, bilateral: Secondary | ICD-10-CM | POA: Diagnosis not present

## 2021-08-31 DIAGNOSIS — H61893 Other specified disorders of external ear, bilateral: Secondary | ICD-10-CM | POA: Diagnosis not present

## 2021-09-01 DIAGNOSIS — M5451 Vertebrogenic low back pain: Secondary | ICD-10-CM | POA: Diagnosis not present

## 2021-09-04 ENCOUNTER — Ambulatory Visit
Admission: RE | Admit: 2021-09-04 | Discharge: 2021-09-04 | Disposition: A | Discharge status: Home / Self Care | Payer: Medicare PPO | Source: Ambulatory Visit | Attending: Family Medicine | Admitting: Family Medicine

## 2021-09-04 DIAGNOSIS — Z1231 Encounter for screening mammogram for malignant neoplasm of breast: Secondary | ICD-10-CM | POA: Diagnosis not present

## 2021-09-05 DIAGNOSIS — M5451 Vertebrogenic low back pain: Secondary | ICD-10-CM | POA: Diagnosis not present

## 2021-09-07 DIAGNOSIS — M5416 Radiculopathy, lumbar region: Secondary | ICD-10-CM | POA: Diagnosis not present

## 2021-09-07 DIAGNOSIS — M47816 Spondylosis without myelopathy or radiculopathy, lumbar region: Secondary | ICD-10-CM | POA: Diagnosis not present

## 2021-09-08 DIAGNOSIS — M5451 Vertebrogenic low back pain: Secondary | ICD-10-CM | POA: Diagnosis not present

## 2021-09-12 DIAGNOSIS — M5451 Vertebrogenic low back pain: Secondary | ICD-10-CM | POA: Diagnosis not present

## 2021-09-13 DIAGNOSIS — L2089 Other atopic dermatitis: Secondary | ICD-10-CM | POA: Diagnosis not present

## 2021-09-13 DIAGNOSIS — M5416 Radiculopathy, lumbar region: Secondary | ICD-10-CM | POA: Diagnosis not present

## 2021-09-18 DIAGNOSIS — M5451 Vertebrogenic low back pain: Secondary | ICD-10-CM | POA: Diagnosis not present

## 2021-09-26 DIAGNOSIS — S83272A Complex tear of lateral meniscus, current injury, left knee, initial encounter: Secondary | ICD-10-CM | POA: Diagnosis not present

## 2021-09-26 DIAGNOSIS — M94262 Chondromalacia, left knee: Secondary | ICD-10-CM | POA: Diagnosis not present

## 2021-09-26 DIAGNOSIS — S83232A Complex tear of medial meniscus, current injury, left knee, initial encounter: Secondary | ICD-10-CM | POA: Diagnosis not present

## 2021-09-26 DIAGNOSIS — G8918 Other acute postprocedural pain: Secondary | ICD-10-CM | POA: Diagnosis not present

## 2021-10-03 DIAGNOSIS — M25562 Pain in left knee: Secondary | ICD-10-CM | POA: Diagnosis not present

## 2021-10-03 DIAGNOSIS — R2689 Other abnormalities of gait and mobility: Secondary | ICD-10-CM | POA: Diagnosis not present

## 2021-10-03 DIAGNOSIS — M5451 Vertebrogenic low back pain: Secondary | ICD-10-CM | POA: Diagnosis not present

## 2021-10-05 DIAGNOSIS — M5451 Vertebrogenic low back pain: Secondary | ICD-10-CM | POA: Diagnosis not present

## 2021-10-05 DIAGNOSIS — M25562 Pain in left knee: Secondary | ICD-10-CM | POA: Diagnosis not present

## 2021-10-05 DIAGNOSIS — R2689 Other abnormalities of gait and mobility: Secondary | ICD-10-CM | POA: Diagnosis not present

## 2021-10-10 DIAGNOSIS — R2689 Other abnormalities of gait and mobility: Secondary | ICD-10-CM | POA: Diagnosis not present

## 2021-10-10 DIAGNOSIS — M25562 Pain in left knee: Secondary | ICD-10-CM | POA: Diagnosis not present

## 2021-10-10 DIAGNOSIS — M5451 Vertebrogenic low back pain: Secondary | ICD-10-CM | POA: Diagnosis not present

## 2021-10-12 DIAGNOSIS — R2689 Other abnormalities of gait and mobility: Secondary | ICD-10-CM | POA: Diagnosis not present

## 2021-10-12 DIAGNOSIS — M5451 Vertebrogenic low back pain: Secondary | ICD-10-CM | POA: Diagnosis not present

## 2021-10-12 DIAGNOSIS — M25562 Pain in left knee: Secondary | ICD-10-CM | POA: Diagnosis not present

## 2021-10-13 ENCOUNTER — Ambulatory Visit: Payer: Medicare PPO | Admitting: Podiatry

## 2021-10-13 ENCOUNTER — Other Ambulatory Visit: Payer: Self-pay

## 2021-10-13 DIAGNOSIS — M778 Other enthesopathies, not elsewhere classified: Secondary | ICD-10-CM | POA: Diagnosis not present

## 2021-10-13 DIAGNOSIS — M79672 Pain in left foot: Secondary | ICD-10-CM

## 2021-10-13 DIAGNOSIS — M19072 Primary osteoarthritis, left ankle and foot: Secondary | ICD-10-CM

## 2021-10-15 NOTE — Progress Notes (Signed)
Subjective: 76 year old female presents the office today with her husband for concerns of left foot pain.  She is attempted numerous conservative treatments including shoe modification, NSAIDs, physical therapy, injections without significant improvement and she continues to have discomfort to her foot.  She presents here with her husband.  Objective: AAO x3, NAD DP/PT pulses palpable bilaterally, CRT less than 3 seconds Dorsal spurring present the dorsal medial aspect the midfoot as well as the medial aspect along the navicular area.  This is where she has a localized area of discomfort.  No other areas of discomfort identified.  No other areas of edema, erythema.  Decreased medial arch upon weightbearing.  Flexor, extensor tendons appear to be intact.  MMT 5/5. No pain with calf compression, swelling, warmth, erythema  Assessment: Arthritis left foot, capsulitis  Plan: -All treatment options discussed with the patient including all alternatives, risks, complications.  -Overall symptoms have not improved and she has pain on daily basis.  She is attempted numerous conservative treatments.  We discussed that surgical intervention.  We discussed the surgery as well as postoperative course.  She is to consider her options.  Should she want to proceed with surgery will order CT or MRI prior to the surgery for planning purposes.  Trula Slade DPM

## 2021-10-17 DIAGNOSIS — M25562 Pain in left knee: Secondary | ICD-10-CM | POA: Diagnosis not present

## 2021-10-17 DIAGNOSIS — R2689 Other abnormalities of gait and mobility: Secondary | ICD-10-CM | POA: Diagnosis not present

## 2021-10-17 DIAGNOSIS — M5451 Vertebrogenic low back pain: Secondary | ICD-10-CM | POA: Diagnosis not present

## 2021-10-18 DIAGNOSIS — Z6821 Body mass index (BMI) 21.0-21.9, adult: Secondary | ICD-10-CM | POA: Diagnosis not present

## 2021-10-18 DIAGNOSIS — M5416 Radiculopathy, lumbar region: Secondary | ICD-10-CM | POA: Diagnosis not present

## 2021-10-18 DIAGNOSIS — M47816 Spondylosis without myelopathy or radiculopathy, lumbar region: Secondary | ICD-10-CM | POA: Diagnosis not present

## 2021-10-19 DIAGNOSIS — M25562 Pain in left knee: Secondary | ICD-10-CM | POA: Diagnosis not present

## 2021-10-19 DIAGNOSIS — M5451 Vertebrogenic low back pain: Secondary | ICD-10-CM | POA: Diagnosis not present

## 2021-10-19 DIAGNOSIS — R2689 Other abnormalities of gait and mobility: Secondary | ICD-10-CM | POA: Diagnosis not present

## 2021-10-26 DIAGNOSIS — M25562 Pain in left knee: Secondary | ICD-10-CM | POA: Diagnosis not present

## 2021-10-26 DIAGNOSIS — R2689 Other abnormalities of gait and mobility: Secondary | ICD-10-CM | POA: Diagnosis not present

## 2021-10-26 DIAGNOSIS — M5451 Vertebrogenic low back pain: Secondary | ICD-10-CM | POA: Diagnosis not present

## 2021-10-30 DIAGNOSIS — M5451 Vertebrogenic low back pain: Secondary | ICD-10-CM | POA: Diagnosis not present

## 2021-10-30 DIAGNOSIS — M25562 Pain in left knee: Secondary | ICD-10-CM | POA: Diagnosis not present

## 2021-10-30 DIAGNOSIS — R2689 Other abnormalities of gait and mobility: Secondary | ICD-10-CM | POA: Diagnosis not present

## 2021-11-02 DIAGNOSIS — M5451 Vertebrogenic low back pain: Secondary | ICD-10-CM | POA: Diagnosis not present

## 2021-11-02 DIAGNOSIS — M25562 Pain in left knee: Secondary | ICD-10-CM | POA: Diagnosis not present

## 2021-11-02 DIAGNOSIS — R2689 Other abnormalities of gait and mobility: Secondary | ICD-10-CM | POA: Diagnosis not present

## 2021-11-07 DIAGNOSIS — M1712 Unilateral primary osteoarthritis, left knee: Secondary | ICD-10-CM | POA: Diagnosis not present

## 2021-11-07 DIAGNOSIS — R9431 Abnormal electrocardiogram [ECG] [EKG]: Secondary | ICD-10-CM | POA: Diagnosis not present

## 2021-11-07 DIAGNOSIS — Z01818 Encounter for other preprocedural examination: Secondary | ICD-10-CM | POA: Diagnosis not present

## 2021-11-07 DIAGNOSIS — M778 Other enthesopathies, not elsewhere classified: Secondary | ICD-10-CM | POA: Diagnosis not present

## 2021-11-07 DIAGNOSIS — M67823 Other specified disorders of tendon, right elbow: Secondary | ICD-10-CM | POA: Diagnosis not present

## 2021-11-07 DIAGNOSIS — I700 Atherosclerosis of aorta: Secondary | ICD-10-CM | POA: Diagnosis not present

## 2021-11-07 DIAGNOSIS — E7800 Pure hypercholesterolemia, unspecified: Secondary | ICD-10-CM | POA: Diagnosis not present

## 2021-11-09 DIAGNOSIS — M5451 Vertebrogenic low back pain: Secondary | ICD-10-CM | POA: Diagnosis not present

## 2021-11-09 DIAGNOSIS — M25562 Pain in left knee: Secondary | ICD-10-CM | POA: Diagnosis not present

## 2021-11-09 DIAGNOSIS — R2689 Other abnormalities of gait and mobility: Secondary | ICD-10-CM | POA: Diagnosis not present

## 2021-11-13 DIAGNOSIS — M25562 Pain in left knee: Secondary | ICD-10-CM | POA: Diagnosis not present

## 2021-11-13 DIAGNOSIS — M5451 Vertebrogenic low back pain: Secondary | ICD-10-CM | POA: Diagnosis not present

## 2021-11-13 DIAGNOSIS — R2689 Other abnormalities of gait and mobility: Secondary | ICD-10-CM | POA: Diagnosis not present

## 2021-11-15 DIAGNOSIS — M47816 Spondylosis without myelopathy or radiculopathy, lumbar region: Secondary | ICD-10-CM | POA: Diagnosis not present

## 2021-11-20 DIAGNOSIS — M25562 Pain in left knee: Secondary | ICD-10-CM | POA: Diagnosis not present

## 2021-11-27 DIAGNOSIS — M19072 Primary osteoarthritis, left ankle and foot: Secondary | ICD-10-CM | POA: Diagnosis not present

## 2021-11-27 DIAGNOSIS — M79672 Pain in left foot: Secondary | ICD-10-CM | POA: Diagnosis not present

## 2021-12-05 DIAGNOSIS — M1712 Unilateral primary osteoarthritis, left knee: Secondary | ICD-10-CM | POA: Diagnosis not present

## 2021-12-05 DIAGNOSIS — G8918 Other acute postprocedural pain: Secondary | ICD-10-CM | POA: Diagnosis not present

## 2021-12-13 DIAGNOSIS — M25562 Pain in left knee: Secondary | ICD-10-CM | POA: Diagnosis not present

## 2021-12-13 DIAGNOSIS — R2689 Other abnormalities of gait and mobility: Secondary | ICD-10-CM | POA: Diagnosis not present

## 2021-12-15 DIAGNOSIS — M25562 Pain in left knee: Secondary | ICD-10-CM | POA: Diagnosis not present

## 2021-12-15 DIAGNOSIS — R2689 Other abnormalities of gait and mobility: Secondary | ICD-10-CM | POA: Diagnosis not present

## 2021-12-18 DIAGNOSIS — M25562 Pain in left knee: Secondary | ICD-10-CM | POA: Diagnosis not present

## 2021-12-18 DIAGNOSIS — R2689 Other abnormalities of gait and mobility: Secondary | ICD-10-CM | POA: Diagnosis not present

## 2021-12-20 DIAGNOSIS — R2689 Other abnormalities of gait and mobility: Secondary | ICD-10-CM | POA: Diagnosis not present

## 2021-12-20 DIAGNOSIS — M25562 Pain in left knee: Secondary | ICD-10-CM | POA: Diagnosis not present

## 2021-12-21 DIAGNOSIS — R2689 Other abnormalities of gait and mobility: Secondary | ICD-10-CM | POA: Diagnosis not present

## 2021-12-21 DIAGNOSIS — M25562 Pain in left knee: Secondary | ICD-10-CM | POA: Diagnosis not present

## 2021-12-22 DIAGNOSIS — Z4789 Encounter for other orthopedic aftercare: Secondary | ICD-10-CM | POA: Diagnosis not present

## 2021-12-25 DIAGNOSIS — M25562 Pain in left knee: Secondary | ICD-10-CM | POA: Diagnosis not present

## 2021-12-25 DIAGNOSIS — R2689 Other abnormalities of gait and mobility: Secondary | ICD-10-CM | POA: Diagnosis not present

## 2021-12-27 DIAGNOSIS — R2689 Other abnormalities of gait and mobility: Secondary | ICD-10-CM | POA: Diagnosis not present

## 2021-12-27 DIAGNOSIS — M25562 Pain in left knee: Secondary | ICD-10-CM | POA: Diagnosis not present

## 2021-12-28 DIAGNOSIS — M25562 Pain in left knee: Secondary | ICD-10-CM | POA: Diagnosis not present

## 2021-12-28 DIAGNOSIS — R2689 Other abnormalities of gait and mobility: Secondary | ICD-10-CM | POA: Diagnosis not present

## 2022-01-02 DIAGNOSIS — R2689 Other abnormalities of gait and mobility: Secondary | ICD-10-CM | POA: Diagnosis not present

## 2022-01-02 DIAGNOSIS — M25562 Pain in left knee: Secondary | ICD-10-CM | POA: Diagnosis not present

## 2022-01-03 DIAGNOSIS — R2689 Other abnormalities of gait and mobility: Secondary | ICD-10-CM | POA: Diagnosis not present

## 2022-01-03 DIAGNOSIS — M25562 Pain in left knee: Secondary | ICD-10-CM | POA: Diagnosis not present

## 2022-01-04 DIAGNOSIS — R2689 Other abnormalities of gait and mobility: Secondary | ICD-10-CM | POA: Diagnosis not present

## 2022-01-04 DIAGNOSIS — M25562 Pain in left knee: Secondary | ICD-10-CM | POA: Diagnosis not present

## 2022-01-10 DIAGNOSIS — M25562 Pain in left knee: Secondary | ICD-10-CM | POA: Diagnosis not present

## 2022-01-10 DIAGNOSIS — R2689 Other abnormalities of gait and mobility: Secondary | ICD-10-CM | POA: Diagnosis not present

## 2022-01-12 DIAGNOSIS — M25562 Pain in left knee: Secondary | ICD-10-CM | POA: Diagnosis not present

## 2022-01-12 DIAGNOSIS — R2689 Other abnormalities of gait and mobility: Secondary | ICD-10-CM | POA: Diagnosis not present

## 2022-01-15 DIAGNOSIS — M25562 Pain in left knee: Secondary | ICD-10-CM | POA: Diagnosis not present

## 2022-01-15 DIAGNOSIS — R2689 Other abnormalities of gait and mobility: Secondary | ICD-10-CM | POA: Diagnosis not present

## 2022-01-17 DIAGNOSIS — R2689 Other abnormalities of gait and mobility: Secondary | ICD-10-CM | POA: Diagnosis not present

## 2022-01-17 DIAGNOSIS — M25562 Pain in left knee: Secondary | ICD-10-CM | POA: Diagnosis not present

## 2022-01-24 DIAGNOSIS — M25562 Pain in left knee: Secondary | ICD-10-CM | POA: Diagnosis not present

## 2022-01-24 DIAGNOSIS — R2689 Other abnormalities of gait and mobility: Secondary | ICD-10-CM | POA: Diagnosis not present

## 2022-01-26 DIAGNOSIS — M25562 Pain in left knee: Secondary | ICD-10-CM | POA: Diagnosis not present

## 2022-01-26 DIAGNOSIS — R2689 Other abnormalities of gait and mobility: Secondary | ICD-10-CM | POA: Diagnosis not present

## 2022-01-29 DIAGNOSIS — H10022 Other mucopurulent conjunctivitis, left eye: Secondary | ICD-10-CM | POA: Diagnosis not present

## 2022-02-06 DIAGNOSIS — R2689 Other abnormalities of gait and mobility: Secondary | ICD-10-CM | POA: Diagnosis not present

## 2022-02-06 DIAGNOSIS — M25562 Pain in left knee: Secondary | ICD-10-CM | POA: Diagnosis not present

## 2022-02-08 ENCOUNTER — Emergency Department (HOSPITAL_BASED_OUTPATIENT_CLINIC_OR_DEPARTMENT_OTHER): Payer: Medicare PPO

## 2022-02-08 ENCOUNTER — Other Ambulatory Visit: Payer: Self-pay

## 2022-02-08 ENCOUNTER — Encounter (HOSPITAL_BASED_OUTPATIENT_CLINIC_OR_DEPARTMENT_OTHER): Payer: Self-pay | Admitting: Emergency Medicine

## 2022-02-08 ENCOUNTER — Other Ambulatory Visit (HOSPITAL_BASED_OUTPATIENT_CLINIC_OR_DEPARTMENT_OTHER): Payer: Self-pay

## 2022-02-08 ENCOUNTER — Emergency Department (HOSPITAL_BASED_OUTPATIENT_CLINIC_OR_DEPARTMENT_OTHER)
Admission: EM | Admit: 2022-02-08 | Discharge: 2022-02-08 | Disposition: A | Discharge status: Home / Self Care | Payer: Medicare PPO | Attending: Emergency Medicine | Admitting: Emergency Medicine

## 2022-02-08 DIAGNOSIS — R42 Dizziness and giddiness: Secondary | ICD-10-CM | POA: Diagnosis present

## 2022-02-08 DIAGNOSIS — I160 Hypertensive urgency: Secondary | ICD-10-CM | POA: Diagnosis not present

## 2022-02-08 DIAGNOSIS — Z79899 Other long term (current) drug therapy: Secondary | ICD-10-CM | POA: Diagnosis not present

## 2022-02-08 DIAGNOSIS — I10 Essential (primary) hypertension: Secondary | ICD-10-CM | POA: Insufficient documentation

## 2022-02-08 LAB — URINALYSIS, ROUTINE W REFLEX MICROSCOPIC
Bilirubin Urine: NEGATIVE
Glucose, UA: NEGATIVE mg/dL
Hgb urine dipstick: NEGATIVE
Ketones, ur: NEGATIVE mg/dL
Leukocytes,Ua: NEGATIVE
Nitrite: NEGATIVE
Protein, ur: 30 mg/dL — AB
Specific Gravity, Urine: 1.01 (ref 1.005–1.030)
pH: 7 (ref 5.0–8.0)

## 2022-02-08 LAB — CBC
HCT: 45 % (ref 36.0–46.0)
Hemoglobin: 14.4 g/dL (ref 12.0–15.0)
MCH: 30.6 pg (ref 26.0–34.0)
MCHC: 32 g/dL (ref 30.0–36.0)
MCV: 95.7 fL (ref 80.0–100.0)
Platelets: 210 10*3/uL (ref 150–400)
RBC: 4.7 MIL/uL (ref 3.87–5.11)
RDW: 13.1 % (ref 11.5–15.5)
WBC: 5.4 10*3/uL (ref 4.0–10.5)
nRBC: 0 % (ref 0.0–0.2)

## 2022-02-08 LAB — BASIC METABOLIC PANEL
Anion gap: 11 (ref 5–15)
BUN: 19 mg/dL (ref 8–23)
CO2: 27 mmol/L (ref 22–32)
Calcium: 10.2 mg/dL (ref 8.9–10.3)
Chloride: 101 mmol/L (ref 98–111)
Creatinine, Ser: 0.9 mg/dL (ref 0.44–1.00)
GFR, Estimated: >60 mL/min (ref >60)
Glucose, Bld: 104 mg/dL — ABNORMAL HIGH (ref 70–99)
Potassium: 4.2 mmol/L (ref 3.5–5.1)
Sodium: 139 mmol/L (ref 135–145)

## 2022-02-08 LAB — TROPONIN I (HIGH SENSITIVITY)
Troponin I (High Sensitivity): 3 ng/L (ref <18)
Troponin I (High Sensitivity): 4 ng/L (ref <18)

## 2022-02-08 LAB — CBG MONITORING, ED: Glucose-Capillary: 95 mg/dL (ref 70–99)

## 2022-02-08 MED ORDER — HYDRALAZINE HCL 10 MG PO TABS
10.0000 mg | ORAL_TABLET | Freq: Three times a day (TID) | ORAL | 0 refills | Status: AC
  Filled 2022-02-08: qty 3, 1d supply, fill #0

## 2022-02-08 MED ORDER — IOHEXOL 350 MG/ML SOLN
100.0000 mL | Freq: Once | INTRAVENOUS | Status: AC | PRN
  Administered 2022-02-08: 75 mL via INTRAVENOUS

## 2022-02-08 NOTE — ED Triage Notes (Addendum)
Patient presents to ED via POV from home. Patient reports dizziness that began when she woke up at 0900 this morning. Patient went to bed at 2030 last night. Reports she felt off but not dizzy when she went to bed. Reports history of same when her BP was high. HTN noted in triage.

## 2022-02-08 NOTE — ED Notes (Signed)
Patient transported to CT 

## 2022-02-08 NOTE — ED Provider Notes (Signed)
[facility] EMERGENCY DEPT Provider Note   CSN: [id number] Arrival date & time: 02/08/22  1120     History  Chief Complaint  Patient presents with   Dizziness    Kimberly [name] is a 76 y.o. female.   Dizziness Associated symptoms: headaches   Associated symptoms: no chest pain, no palpitations, no shortness of breath and no vomiting    76 year old female presents emergency department with complaints of dizziness and headache.  Patient states that symptoms began upon awakening this morning.  She had a similar episode 8 months ago on 06/21/2021 prompting a ED visit.  She took her blood pressure at home and it was 160s over 100s prompting her visit to the emergency department.  She secondarily notes "changes in walking" and minimal shortness of breath.  When asked specifically to describe the symptoms she described them as feeling like she needs to continue walking when she stops.  Denies instability when walking, weakness, sensory deficits, difficulty speaking, facial droop, changes in vision/double vision, feelings of the room spinning. Denies fever, chills, night sweats, chest pain, abdominal pain, N/V/D, urinary/vaginal symptoms, change in bowel habits.  Home Medications Prior to Admission medications   Medication Sig Start Date End Date Taking? Authorizing Provider  atorvastatin (LIPITOR) 20 MG tablet  09/28/19  Yes [provider]  hydrALAZINE (APRESOLINE) 10 MG tablet Take 1 tablet (10 mg total) by mouth 3 (three) times daily. 02/08/22  Yes Sherian Maroon A, PA  metoprolol tartrate (LOPRESSOR) 25 MG tablet Take 1 tablet (25 mg total) by mouth 2 (two) times daily. 07/08/11 02/08/22 Yes Rodolph Bong, MD  meloxicam (MOBIC) 7.5 MG tablet TAKE 1 TABLET BY MOUTH EVERY DAY Patient not taking: Reported on 02/08/2022 02/15/21   Vivi Barrack, DPM      Allergies    Cyclobenzaprine, Flexeril [cyclobenzaprine hcl], Metaxalone, Skelaxin, Levofloxacin,  Penicillins, Sulfamethoxazole-trimethoprim, and Tizanidine hcl    Review of Systems   Review of Systems  Constitutional:  Negative for chills and fever.  HENT:  Negative for ear pain and sore throat.   Eyes:  Negative for pain and visual disturbance.  Respiratory:  Negative for cough and shortness of breath.   Cardiovascular:  Negative for chest pain and palpitations.  Gastrointestinal:  Negative for abdominal pain and vomiting.  Genitourinary:  Negative for dysuria and hematuria.  Musculoskeletal:  Negative for arthralgias and back pain.  Skin:  Negative for color change and rash.  Neurological:  Positive for dizziness and headaches. Negative for seizures and syncope.  All other systems reviewed and are negative.   Physical Exam Updated Vital Signs BP (!) 141/59   Pulse 64   Temp 97.8 F (36.6 C) (Oral)   Resp 19   Ht 5\' 2"  (1.575 m)   Wt 47.6 kg   SpO2 100%   BMI 19.20 kg/m  Physical Exam Vitals and nursing note reviewed.  Constitutional:      General: She is not in acute distress.    Appearance: Normal appearance. She is well-developed. She is not ill-appearing, toxic-appearing or diaphoretic.     Comments: Patient noted continued dizziness and headache upon HPI.  Blood pressure is now 165/88 on monitor.  HENT:     Head: Normocephalic and atraumatic.     Right Ear: Tympanic membrane normal.     Left Ear: Tympanic membrane normal.     Nose: Nose normal.     Mouth/Throat:     Mouth: Mucous membranes are moist.  Eyes:  General: No visual field deficit.       Right eye: No discharge.        Left eye: No discharge.     Extraocular Movements: Extraocular movements intact.     Conjunctiva/sclera: Conjunctivae normal.     Pupils: Pupils are equal, round, and reactive to light.  Cardiovascular:     Rate and Rhythm: Normal rate and regular rhythm.     Heart sounds: No murmur heard. Pulmonary:     Effort: Pulmonary effort is normal. No respiratory distress.     Breath  sounds: Normal breath sounds.  Abdominal:     Palpations: Abdomen is soft.     Tenderness: There is no abdominal tenderness. There is no guarding.  Musculoskeletal:        General: No swelling.     Cervical back: Neck supple.     Comments: Incision noted on left knee.  Sensory deficit noted just inferior to surgical scar.  Skin:    General: Skin is warm and dry.     Capillary Refill: Capillary refill takes less than 2 seconds.  Neurological:     Mental Status: She is alert.     Cranial Nerves: No dysarthria or facial asymmetry.     Sensory: Sensation is intact.     Motor: Motor function is intact. No pronator drift.     Coordination: Coordination is intact. Romberg sign negative. Coordination normal. Finger-Nose-Finger Test and Heel to [facility] Test normal. Rapid alternating movements normal.     Gait: Gait is intact. Gait normal.     Comments: Cranial nerves III through XII grossly intact. Muscle strength 5 out of 5 for upper and lower extremities.  No sensory deficits along major nerve distributions of upper or lower extremities.  Posterior tibial and radial pulses full and intact bilaterally.  DTRs normal and symmetric bilaterally.  Psychiatric:        Mood and Affect: Mood normal.     ED Results / Procedures / Treatments   Labs (all labs ordered are listed, but only abnormal results are displayed) Labs Reviewed  BASIC METABOLIC PANEL - Abnormal; Notable for the following components:      Result Value   Glucose, Bld 104 (*)    All other components within normal limits  URINALYSIS, ROUTINE W REFLEX MICROSCOPIC - Abnormal; Notable for the following components:   Protein, ur 30 (*)    All other components within normal limits  CBC  CBG MONITORING, ED  TROPONIN I (HIGH SENSITIVITY)  TROPONIN I (HIGH SENSITIVITY)    EKG EKG Interpretation  Date/Time:  Thursday February 08 2022 11:36:54 EDT Ventricular Rate:  55 PR Interval:  160 QRS Duration: 70 QT Interval:  440 QTC  Calculation: 420 R Axis:   -37 Text Interpretation: Sinus bradycardia Left axis deviation Abnormal ECG When compared with ECG of 21-Jun-2021 11:11, Premature atrial complexes are no longer Present Vent. rate has decreased BY  37 BPM QRS axis Shifted left QT has shortened Confirmed by Meridee Score [phone]) on 02/08/2022 12:15:38 PM  Radiology MR BRAIN WO CONTRAST  Result Date: 02/08/2022 CLINICAL DATA:  Dizziness beginning this morning EXAM: MRI HEAD WITHOUT CONTRAST TECHNIQUE: Multiplanar, multiecho pulse sequences of the brain and surrounding structures were obtained without intravenous contrast. COMPARISON:  Same-day CT/CTA head and neck FINDINGS: Brain: There is no acute intracranial hemorrhage, extra-axial fluid collection, or acute infarct. Parenchymal volume is normal. The ventricles are normal in size. Gray-white differentiation is preserved. Mild FLAIR signal abnormality in the subcortical  and periventricular white matter is nonspecific but likely reflects sequela of mild chronic white matter microangiopathy. There is no mass lesion. There is no mass effect or midline shift. Vascular: There is a diminutive right V4 segment flow void in keeping with findings on the prior CTA. The other intracranial flow voids are normal. Skull and upper cervical spine: Normal marrow signal. Sinuses/Orbits: Mucosal thickening is again seen in the right sphenoid sinus. Bilateral lens implants are in place. The globes and orbits are otherwise unremarkable. Other: None. IMPRESSION: No acute intracranial pathology. Electronically Signed   By: Valetta Mole M.D.   On: 02/08/2022 15:49   CT ANGIO HEAD NECK W WO CM  Result Date: 02/08/2022 CLINICAL DATA:  Dizziness beginning this morning EXAM: CT ANGIOGRAPHY HEAD AND NECK TECHNIQUE: Multidetector CT imaging of the head and neck was performed using the standard protocol during bolus administration of intravenous contrast. Multiplanar CT image reconstructions and MIPs were  obtained to evaluate the vascular anatomy. Carotid stenosis measurements (when applicable) are obtained utilizing NASCET criteria, using the distal internal carotid diameter as the denominator. RADIATION DOSE REDUCTION: This exam was performed according to the departmental dose-optimization program which includes automated exposure control, adjustment of the mA and/or kV according to patient size and/or use of iterative reconstruction technique. CONTRAST:  37mL OMNIPAQUE IOHEXOL 350 MG/ML SOLN COMPARISON:  CTA head/neck 06/21/2021 FINDINGS: CT HEAD FINDINGS Brain: There is no acute intracranial hemorrhage, extra-axial fluid collection, or acute infarct. Parenchymal volume is within normal limits for age. The ventricles are normal in size. Gray-white differentiation is preserved. There is no mass lesion.  There is no mass effect or midline shift. Vascular: See below. Skull: Normal. Negative for fracture or focal lesion. Sinuses and orbits: There is layering fluid in the right sphenoid sinus. Bilateral lens implants are in place. The globes and orbits are otherwise unremarkable. Other: None. Review of the MIP images confirms the above findings CTA NECK FINDINGS Aortic arch: There is mild calcified atherosclerotic plaque of the aortic arch. The origins of the major branch vessels are patent. The subclavian arteries are patent to the level imaged. Right carotid system: The right common, internal, and external carotid arteries are patent with minimal plaque at the bifurcation but no hemodynamically significant stenosis or occlusion. There is no dissection or aneurysm. Left carotid system: The left common, internal, and external carotid arteries are patent without hemodynamically significant stenosis or occlusion. There is no dissection or aneurysm. Vertebral arteries: The left vertebral artery is dominant. There is mild stenosis at the origin of the left vertebral artery. The vertebral arteries are otherwise patent,  without hemodynamically significant stenosis or occlusion. There is no dissection or aneurysm. Skeleton: There is mild degenerative change of the cervical spine with slight reversal of the normal cervical lordosis and grade 1 anterolisthesis of C3 on C4 and grade 1 retrolisthesis of C5 on C6. There is no acute osseous abnormality or suspicious osseous lesion. There is no visible canal hematoma. Other neck: A 5 mm hypodense lesion in the left submandibular gland is unchanged the soft tissues of the neck are otherwise unremarkable. Upper chest: The imaged lung apices are clear. Review of the MIP images confirms the above findings CTA HEAD FINDINGS Anterior circulation: The intracranial ICAs are patent with mild calcified plaque but no hemodynamically significant stenosis or occlusion. The bilateral MCAs are patent. The bilateral ACAs are patent. The anterior communicating artery is normal. There is no aneurysm or AVM. Posterior circulation: The proximal right V4 segment is  patent but is markedly diminutive distally, similar to the prior study. The dominant left V4 segment is patent. The basilar artery is patent. The major cerebellar artery origins are patent. The bilateral PCAs are patent. Bilateral posterior communicating arteries are identified. There is no aneurysm or AVM. Venous sinuses: As permitted by contrast timing, patent. Anatomic variants: None. Review of the MIP images confirms the above findings IMPRESSION: 1. No acute intracranial pathology. 2. No emergent large vessel occlusion. 3. Diminutive nondominant right V4 segment after the PICA origin, unchanged compared to the study from 2022, likely congenital with possible superimposed stenosis. Otherwise, patent vasculature of the head and neck with no other hemodynamically significant stenosis or occlusion. 4. Layering fluid in the right sphenoid sinus which can be seen with acute sinusitis in the correct clinical setting. 5. Unchanged 5 mm hypodense lesion  in the left submandibular gland. Outpatient ENT referral is again suggested, if not already performed. Electronically Signed   By: Valetta Mole M.D.   On: 02/08/2022 13:51    Procedures Procedures    Medications Ordered in ED Medications  iohexol (OMNIPAQUE) 350 MG/ML injection 100 mL (75 mLs Intravenous Contrast Given 02/08/22 1324)    ED Course/ Medical Decision Making/ A&P Clinical Course as of 02/08/22 2244  Thu Feb 08, 2022  1421 Dr. Leafy Ro of neurology consulted regarding patient and advised MR brain w/o contrast to look further for potential stroke. [CR]  [id number] 76 year old female here with an episode of dizziness lightheadedness unsteadiness that started around 6 AM this morning.  Has improved.  Similar episode about 6 months ago with negative work-up.  Waiting on MRI.  If negative likely outpatient follow-up. [MB]    Clinical Course User Index [CR] Wilnette Kales, PA [MB] Hayden Rasmussen, MD                           Medical Decision Making Amount and/or Complexity of Data Reviewed Labs: ordered. Radiology: ordered.  Risk Prescription drug management.   This patient presents to the ED for concern of headache, dizziness, this involves an extensive number of treatment options, and is a complaint that carries with it a high risk of complications and morbidity.  The differential diagnosis includes Emergent considerations for headache include subarachnoid hemorrhage, meningitis, temporal arteritis, glaucoma, cerebral ischemia, carotid/vertebral dissection, intracranial tumor, Venous sinus thrombosis, carbon monoxide poisoning, acute or chronic subdural hemorrhage.  Other considerations include: Migraine, Cluster headache, Hypertension, Caffeine, alcohol, or drug withdrawal, Pseudotumor cerebri, Arteriovenous malformation, Head injury, Neurocysticercosis, Post-lumbar puncture, Preeclampsia, Tension headache, Sinusitis, Cervical arthritis, Refractive error causing strain, Dental  abscess, Otitis media, Temporomandibular joint syndrome, Depression, Somatoform disorder (eg, somatization) Trigeminal neuralgia, Glossopharyngeal neuralgia.  Co morbidities that complicate the patient evaluation  GERD, hypertension, depression, anxiety, hyperlipidemia, lumbar radiculopathy   Additional history obtained:  Additional history obtained from CT angio and CT head from 06/21/2021 External records from outside source obtained and reviewed including: No acute abnormality.  Diminutive none dominant right vertebral artery after PICA origin with possible superimposed stenosis, 5 mm low-density lesion of the left submandibular gland.   Lab Tests:  I Ordered, and personally interpreted labs.  The pertinent results include: No acute abnormality   Imaging Studies ordered:  I ordered imaging studies including CT angio head and neck, MR brain without contrast I independently visualized and interpreted imaging which showed  CT angio head and neck: No acute intracranial pathology, no emergent large vessel occlusion, diminutive nondominant right V4  segment after the PICA origin unchanged compared to the study of 2022, likely congenital with possible superimposed stenosis.  Otherwise patent vasculature of the head and neck with no other hemodynamically significant stenosis or occlusion.  Layering fluid in the sphenoid sinus which can be seen with acute sinusitis in the correct clinical setting.  Unchanged 5 mm hypodense lesion in the left submandibular gland MR brain: No acute intracranial abnormality I agree with the radiologist interpretation  Cardiac Monitoring: / EKG:  The patient was maintained on a cardiac monitor.  I personally viewed and interpreted the cardiac monitored which showed an underlying rhythm of: Sinus rhythm   Consultations Obtained:  I requested consultation with the Dr.Bahght of neurology,  and discussed lab and imaging findings as well as pertinent plan - they  recommend: MR brain without contrast after CTA angio head and neck was performed.  If MRI was negative, plan is to dispo with close PCP follow-up for further blood pressure management.   Problem List / ED Course / Critical interventions / Medication management  Dizziness Reevaluation of the patient showed that the patient improved I have reviewed the patients home medicines and have made adjustments as needed   Social Determinants of Health:  Denies tobacco, alcohol, illicit drug use.   Test / Admission - Considered:  Dizziness Vitals signs significant for hypertension with initial blood pressure of 209/91 with decreasing down to 141/59 upon discharge without medical intervention.  Patient's symptoms decrease in intensity as blood pressure decreased.. Otherwise within normal range and stable throughout visit. With negative CT angio head and neck negative MR brain, concern for CVA/TIA significantly decreased.  Given patient improvement of symptoms with natural decrease of blood pressure with time fortified clinical assumption.  Patient to have close PCP follow-up tomorrow morning for further blood pressure management/control. Worrisome signs and symptoms were discussed with the patient, and the patient acknowledged understanding to return to the ED if noticed. Patient was stable upon discharge.           Final Clinical Impression(s) / ED Diagnoses Final diagnoses:  Hypertensive urgency  Dizziness    Rx / DC Orders ED Discharge Orders          Ordered    hydrALAZINE (APRESOLINE) 10 MG tablet  3 times daily        02/08/22 1619              Wilnette Kales, [place] 02/08/22 2244    Hayden Rasmussen, MD 02/09/22 [phone]

## 2022-02-08 NOTE — Discharge Instructions (Addendum)
Please follow-up with your primary care provider at [facility] to establish care as well as for future management of your blood pressure tomorrow morning.  I will prescribe medicine to take if your blood pressure gets above 165 systolic.  Take 1 pill and gauge blood pressure response after 30 minutes to an hour.  Since your appointment with your primary care is tomorrow morning, it is unlikely that you will have to take any of this medicine, but I will prescribe just in case.  It is like " rescue medication" for blood pressure.  Please not hesitate to return to the emergency department if the worrisome signs and symptoms we discussed become apparent.

## 2022-05-01 DIAGNOSIS — M25562 Pain in left knee: Secondary | ICD-10-CM | POA: Diagnosis not present

## 2022-05-01 DIAGNOSIS — M5459 Other low back pain: Secondary | ICD-10-CM | POA: Diagnosis not present

## 2022-05-01 DIAGNOSIS — R2689 Other abnormalities of gait and mobility: Secondary | ICD-10-CM | POA: Diagnosis not present

## 2022-05-08 DIAGNOSIS — R2689 Other abnormalities of gait and mobility: Secondary | ICD-10-CM | POA: Diagnosis not present

## 2022-05-08 DIAGNOSIS — M5459 Other low back pain: Secondary | ICD-10-CM | POA: Diagnosis not present

## 2022-05-08 DIAGNOSIS — M25562 Pain in left knee: Secondary | ICD-10-CM | POA: Diagnosis not present

## 2022-05-15 DIAGNOSIS — M5459 Other low back pain: Secondary | ICD-10-CM | POA: Diagnosis not present

## 2022-05-15 DIAGNOSIS — R2689 Other abnormalities of gait and mobility: Secondary | ICD-10-CM | POA: Diagnosis not present

## 2022-05-15 DIAGNOSIS — M25562 Pain in left knee: Secondary | ICD-10-CM | POA: Diagnosis not present

## 2022-05-18 DIAGNOSIS — Z4789 Encounter for other orthopedic aftercare: Secondary | ICD-10-CM | POA: Diagnosis not present

## 2022-05-18 DIAGNOSIS — M792 Neuralgia and neuritis, unspecified: Secondary | ICD-10-CM | POA: Diagnosis not present

## 2022-05-22 DIAGNOSIS — R2689 Other abnormalities of gait and mobility: Secondary | ICD-10-CM | POA: Diagnosis not present

## 2022-05-22 DIAGNOSIS — M5459 Other low back pain: Secondary | ICD-10-CM | POA: Diagnosis not present

## 2022-05-22 DIAGNOSIS — M25562 Pain in left knee: Secondary | ICD-10-CM | POA: Diagnosis not present

## 2022-05-25 DIAGNOSIS — M792 Neuralgia and neuritis, unspecified: Secondary | ICD-10-CM | POA: Diagnosis not present

## 2022-05-25 DIAGNOSIS — M25562 Pain in left knee: Secondary | ICD-10-CM | POA: Diagnosis not present

## 2022-05-29 DIAGNOSIS — M25562 Pain in left knee: Secondary | ICD-10-CM | POA: Diagnosis not present

## 2022-05-29 DIAGNOSIS — M5459 Other low back pain: Secondary | ICD-10-CM | POA: Diagnosis not present

## 2022-05-29 DIAGNOSIS — R2689 Other abnormalities of gait and mobility: Secondary | ICD-10-CM | POA: Diagnosis not present

## 2022-06-01 DIAGNOSIS — I951 Orthostatic hypotension: Secondary | ICD-10-CM | POA: Diagnosis not present

## 2022-06-01 DIAGNOSIS — Z681 Body mass index (BMI) 19 or less, adult: Secondary | ICD-10-CM | POA: Diagnosis not present

## 2022-06-05 DIAGNOSIS — M5459 Other low back pain: Secondary | ICD-10-CM | POA: Diagnosis not present

## 2022-06-05 DIAGNOSIS — R2689 Other abnormalities of gait and mobility: Secondary | ICD-10-CM | POA: Diagnosis not present

## 2022-06-05 DIAGNOSIS — M25562 Pain in left knee: Secondary | ICD-10-CM | POA: Diagnosis not present

## 2022-06-12 DIAGNOSIS — M5459 Other low back pain: Secondary | ICD-10-CM | POA: Diagnosis not present

## 2022-06-12 DIAGNOSIS — R2689 Other abnormalities of gait and mobility: Secondary | ICD-10-CM | POA: Diagnosis not present

## 2022-06-12 DIAGNOSIS — M25562 Pain in left knee: Secondary | ICD-10-CM | POA: Diagnosis not present

## 2022-06-19 DIAGNOSIS — R2689 Other abnormalities of gait and mobility: Secondary | ICD-10-CM | POA: Diagnosis not present

## 2022-06-19 DIAGNOSIS — M25562 Pain in left knee: Secondary | ICD-10-CM | POA: Diagnosis not present

## 2022-06-19 DIAGNOSIS — M5459 Other low back pain: Secondary | ICD-10-CM | POA: Diagnosis not present

## 2022-06-21 DIAGNOSIS — G894 Chronic pain syndrome: Secondary | ICD-10-CM | POA: Diagnosis not present

## 2022-06-21 DIAGNOSIS — M7918 Myalgia, other site: Secondary | ICD-10-CM | POA: Diagnosis not present

## 2022-06-21 DIAGNOSIS — M47816 Spondylosis without myelopathy or radiculopathy, lumbar region: Secondary | ICD-10-CM | POA: Diagnosis not present

## 2022-06-21 DIAGNOSIS — M47814 Spondylosis without myelopathy or radiculopathy, thoracic region: Secondary | ICD-10-CM | POA: Diagnosis not present

## 2022-06-21 DIAGNOSIS — Z79899 Other long term (current) drug therapy: Secondary | ICD-10-CM | POA: Diagnosis not present

## 2022-06-21 DIAGNOSIS — Z79891 Long term (current) use of opiate analgesic: Secondary | ICD-10-CM | POA: Diagnosis not present

## 2022-06-21 DIAGNOSIS — M5416 Radiculopathy, lumbar region: Secondary | ICD-10-CM | POA: Diagnosis not present

## 2022-06-26 DIAGNOSIS — M25562 Pain in left knee: Secondary | ICD-10-CM | POA: Diagnosis not present

## 2022-06-26 DIAGNOSIS — R2689 Other abnormalities of gait and mobility: Secondary | ICD-10-CM | POA: Diagnosis not present

## 2022-06-26 DIAGNOSIS — M5459 Other low back pain: Secondary | ICD-10-CM | POA: Diagnosis not present

## 2022-07-02 DIAGNOSIS — M5459 Other low back pain: Secondary | ICD-10-CM | POA: Diagnosis not present

## 2022-07-02 DIAGNOSIS — M25562 Pain in left knee: Secondary | ICD-10-CM | POA: Diagnosis not present

## 2022-07-02 DIAGNOSIS — R2689 Other abnormalities of gait and mobility: Secondary | ICD-10-CM | POA: Diagnosis not present

## 2022-07-10 DIAGNOSIS — M5459 Other low back pain: Secondary | ICD-10-CM | POA: Diagnosis not present

## 2022-07-10 DIAGNOSIS — R2689 Other abnormalities of gait and mobility: Secondary | ICD-10-CM | POA: Diagnosis not present

## 2022-07-10 DIAGNOSIS — M25562 Pain in left knee: Secondary | ICD-10-CM | POA: Diagnosis not present

## 2022-07-12 DIAGNOSIS — M5459 Other low back pain: Secondary | ICD-10-CM | POA: Diagnosis not present

## 2022-07-12 DIAGNOSIS — R2689 Other abnormalities of gait and mobility: Secondary | ICD-10-CM | POA: Diagnosis not present

## 2022-07-12 DIAGNOSIS — M25562 Pain in left knee: Secondary | ICD-10-CM | POA: Diagnosis not present

## 2022-07-17 DIAGNOSIS — M5459 Other low back pain: Secondary | ICD-10-CM | POA: Diagnosis not present

## 2022-07-17 DIAGNOSIS — M25562 Pain in left knee: Secondary | ICD-10-CM | POA: Diagnosis not present

## 2022-07-17 DIAGNOSIS — R2689 Other abnormalities of gait and mobility: Secondary | ICD-10-CM | POA: Diagnosis not present

## 2022-07-24 DIAGNOSIS — M25562 Pain in left knee: Secondary | ICD-10-CM | POA: Diagnosis not present

## 2022-07-24 DIAGNOSIS — M5459 Other low back pain: Secondary | ICD-10-CM | POA: Diagnosis not present

## 2022-07-24 DIAGNOSIS — R2689 Other abnormalities of gait and mobility: Secondary | ICD-10-CM | POA: Diagnosis not present

## 2022-07-26 DIAGNOSIS — J0110 Acute frontal sinusitis, unspecified: Secondary | ICD-10-CM | POA: Diagnosis not present

## 2022-07-26 DIAGNOSIS — R051 Acute cough: Secondary | ICD-10-CM | POA: Diagnosis not present

## 2022-07-26 DIAGNOSIS — R059 Cough, unspecified: Secondary | ICD-10-CM | POA: Diagnosis not present

## 2022-07-31 DIAGNOSIS — R2689 Other abnormalities of gait and mobility: Secondary | ICD-10-CM | POA: Diagnosis not present

## 2022-07-31 DIAGNOSIS — M25562 Pain in left knee: Secondary | ICD-10-CM | POA: Diagnosis not present

## 2022-07-31 DIAGNOSIS — M5459 Other low back pain: Secondary | ICD-10-CM | POA: Diagnosis not present

## 2022-08-09 DIAGNOSIS — M5459 Other low back pain: Secondary | ICD-10-CM | POA: Diagnosis not present

## 2022-08-09 DIAGNOSIS — R2689 Other abnormalities of gait and mobility: Secondary | ICD-10-CM | POA: Diagnosis not present

## 2022-08-09 DIAGNOSIS — M25562 Pain in left knee: Secondary | ICD-10-CM | POA: Diagnosis not present

## 2022-08-14 DIAGNOSIS — R2689 Other abnormalities of gait and mobility: Secondary | ICD-10-CM | POA: Diagnosis not present

## 2022-08-14 DIAGNOSIS — M25562 Pain in left knee: Secondary | ICD-10-CM | POA: Diagnosis not present

## 2022-08-14 DIAGNOSIS — M5459 Other low back pain: Secondary | ICD-10-CM | POA: Diagnosis not present

## 2022-08-28 DIAGNOSIS — H6123 Impacted cerumen, bilateral: Secondary | ICD-10-CM | POA: Diagnosis not present

## 2022-08-29 DIAGNOSIS — M47816 Spondylosis without myelopathy or radiculopathy, lumbar region: Secondary | ICD-10-CM | POA: Diagnosis not present

## 2022-08-29 DIAGNOSIS — M47814 Spondylosis without myelopathy or radiculopathy, thoracic region: Secondary | ICD-10-CM | POA: Diagnosis not present

## 2022-08-29 DIAGNOSIS — M461 Sacroiliitis, not elsewhere classified: Secondary | ICD-10-CM | POA: Diagnosis not present

## 2022-08-30 DIAGNOSIS — Z6820 Body mass index (BMI) 20.0-20.9, adult: Secondary | ICD-10-CM | POA: Diagnosis not present

## 2022-08-30 DIAGNOSIS — F419 Anxiety disorder, unspecified: Secondary | ICD-10-CM | POA: Diagnosis not present

## 2022-08-30 DIAGNOSIS — H1032 Unspecified acute conjunctivitis, left eye: Secondary | ICD-10-CM | POA: Diagnosis not present

## 2022-09-06 DIAGNOSIS — M5459 Other low back pain: Secondary | ICD-10-CM | POA: Diagnosis not present

## 2022-09-06 DIAGNOSIS — R2689 Other abnormalities of gait and mobility: Secondary | ICD-10-CM | POA: Diagnosis not present

## 2022-09-06 DIAGNOSIS — M25562 Pain in left knee: Secondary | ICD-10-CM | POA: Diagnosis not present

## 2022-09-13 DIAGNOSIS — Z0000 Encounter for general adult medical examination without abnormal findings: Secondary | ICD-10-CM | POA: Diagnosis not present

## 2022-09-13 DIAGNOSIS — R2689 Other abnormalities of gait and mobility: Secondary | ICD-10-CM | POA: Diagnosis not present

## 2022-09-13 DIAGNOSIS — I700 Atherosclerosis of aorta: Secondary | ICD-10-CM | POA: Diagnosis not present

## 2022-09-13 DIAGNOSIS — M25562 Pain in left knee: Secondary | ICD-10-CM | POA: Diagnosis not present

## 2022-09-13 DIAGNOSIS — M5459 Other low back pain: Secondary | ICD-10-CM | POA: Diagnosis not present

## 2022-09-13 DIAGNOSIS — I10 Essential (primary) hypertension: Secondary | ICD-10-CM | POA: Diagnosis not present

## 2022-09-13 DIAGNOSIS — Z681 Body mass index (BMI) 19 or less, adult: Secondary | ICD-10-CM | POA: Diagnosis not present

## 2022-09-13 DIAGNOSIS — M8580 Other specified disorders of bone density and structure, unspecified site: Secondary | ICD-10-CM | POA: Diagnosis not present

## 2022-09-13 DIAGNOSIS — E7800 Pure hypercholesterolemia, unspecified: Secondary | ICD-10-CM | POA: Diagnosis not present

## 2022-09-13 DIAGNOSIS — F419 Anxiety disorder, unspecified: Secondary | ICD-10-CM | POA: Diagnosis not present

## 2022-09-18 DIAGNOSIS — Z0000 Encounter for general adult medical examination without abnormal findings: Secondary | ICD-10-CM | POA: Diagnosis not present

## 2022-09-18 DIAGNOSIS — E7800 Pure hypercholesterolemia, unspecified: Secondary | ICD-10-CM | POA: Diagnosis not present

## 2022-09-18 DIAGNOSIS — M8580 Other specified disorders of bone density and structure, unspecified site: Secondary | ICD-10-CM | POA: Diagnosis not present

## 2022-09-18 DIAGNOSIS — I700 Atherosclerosis of aorta: Secondary | ICD-10-CM | POA: Diagnosis not present

## 2022-09-18 DIAGNOSIS — I10 Essential (primary) hypertension: Secondary | ICD-10-CM | POA: Diagnosis not present

## 2022-09-20 DIAGNOSIS — R42 Dizziness and giddiness: Secondary | ICD-10-CM | POA: Diagnosis not present

## 2022-09-20 DIAGNOSIS — I10 Essential (primary) hypertension: Secondary | ICD-10-CM | POA: Diagnosis not present

## 2022-09-20 DIAGNOSIS — E7800 Pure hypercholesterolemia, unspecified: Secondary | ICD-10-CM | POA: Diagnosis not present

## 2022-09-24 DIAGNOSIS — M5459 Other low back pain: Secondary | ICD-10-CM | POA: Diagnosis not present

## 2022-09-24 DIAGNOSIS — R2689 Other abnormalities of gait and mobility: Secondary | ICD-10-CM | POA: Diagnosis not present

## 2022-09-24 DIAGNOSIS — M25562 Pain in left knee: Secondary | ICD-10-CM | POA: Diagnosis not present

## 2022-10-01 DIAGNOSIS — M461 Sacroiliitis, not elsewhere classified: Secondary | ICD-10-CM | POA: Diagnosis not present

## 2022-10-04 DIAGNOSIS — U071 COVID-19: Secondary | ICD-10-CM | POA: Diagnosis not present

## 2022-10-04 DIAGNOSIS — R059 Cough, unspecified: Secondary | ICD-10-CM | POA: Diagnosis not present

## 2022-10-11 DIAGNOSIS — M5459 Other low back pain: Secondary | ICD-10-CM | POA: Diagnosis not present

## 2022-10-11 DIAGNOSIS — M25562 Pain in left knee: Secondary | ICD-10-CM | POA: Diagnosis not present

## 2022-10-11 DIAGNOSIS — R2689 Other abnormalities of gait and mobility: Secondary | ICD-10-CM | POA: Diagnosis not present

## 2022-10-22 DIAGNOSIS — Z6820 Body mass index (BMI) 20.0-20.9, adult: Secondary | ICD-10-CM | POA: Diagnosis not present

## 2022-10-22 DIAGNOSIS — J069 Acute upper respiratory infection, unspecified: Secondary | ICD-10-CM | POA: Diagnosis not present

## 2022-10-22 DIAGNOSIS — R059 Cough, unspecified: Secondary | ICD-10-CM | POA: Diagnosis not present

## 2022-10-25 DIAGNOSIS — R059 Cough, unspecified: Secondary | ICD-10-CM | POA: Diagnosis not present

## 2022-10-25 IMAGING — MR MR HEAD W/O CM
11 series · 48 of 48 positions shown · non-contrast
Comparison: Same-day CT/CTA head and neck

CLINICAL DATA: Dizziness beginning this morning

EXAM:
MRI HEAD WITHOUT CONTRAST
TECHNIQUE: Multiplanar, multiecho pulse sequences of the brain and surrounding
structures were obtained without intravenous contrast.

[Series 2: DWI · axial · 3.0mm · 1.46mm/px · z∈[-52,+109]mm · 8 of 103 slices shown (1 of 4)]
[im 1/103]
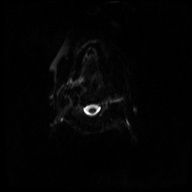
[im 15/103]
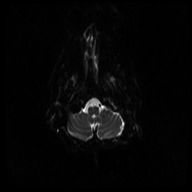
[im 30/103]
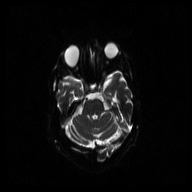
[im 44/103]
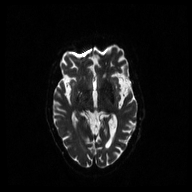
[im 59/103]
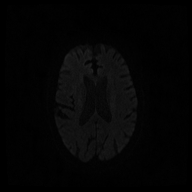
[im 73/103]
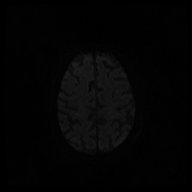
[im 88/103]
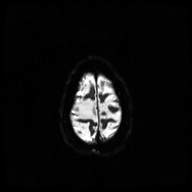
[im 103/103]
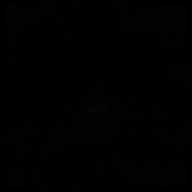

[Series 3: DWI · axial · 3.0mm · 1.46mm/px · z∈[-52,+109]mm · 5 of 55 slices shown (2 of 4)]
[im 1/55]
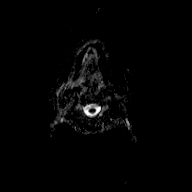
[im 14/55]
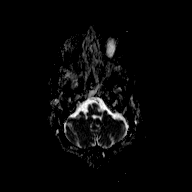
[im 28/55]
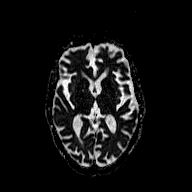
[im 41/55]
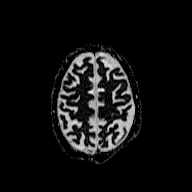
[im 55/55]
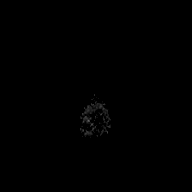

[Series 6: T1 · sagittal · 5.0mm · 0.45mm/px · 2 of 23 slices shown (1 of 2)]
[im 1/23]
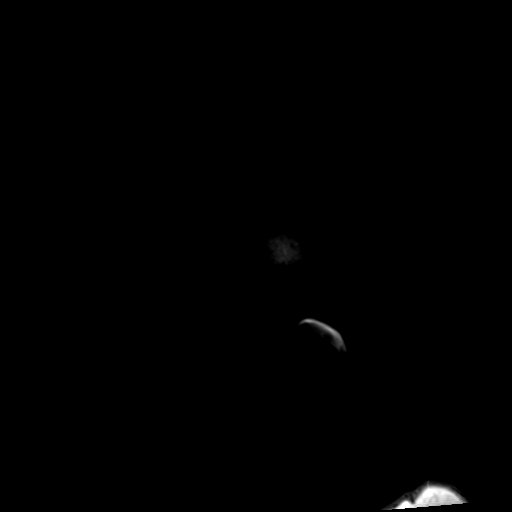
[im 23/23]
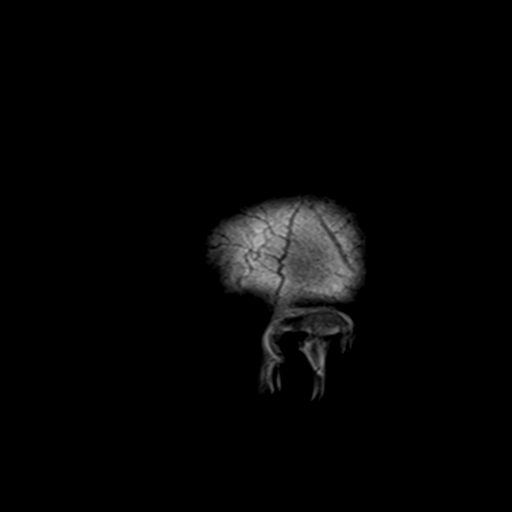

[Series 7: T2 · axial · 5.0mm · 0.72mm/px · z∈[-54,+106]mm · 2 of 24 slices shown (1 of 4)]
[im 1/24]
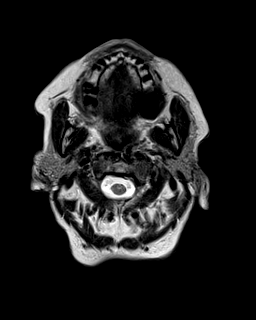
[im 24/24]
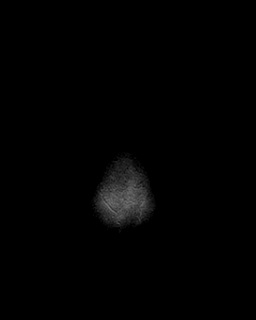

[Series 8: FLAIR · axial · 3.0mm · 0.45mm/px · z∈[-58,+109]mm · 4 of 43 slices shown]
[im 1/43]
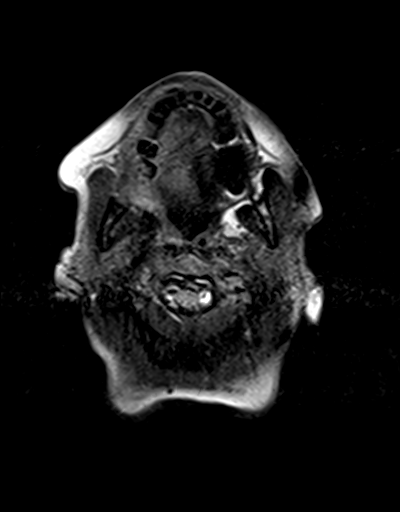
[im 15/43]
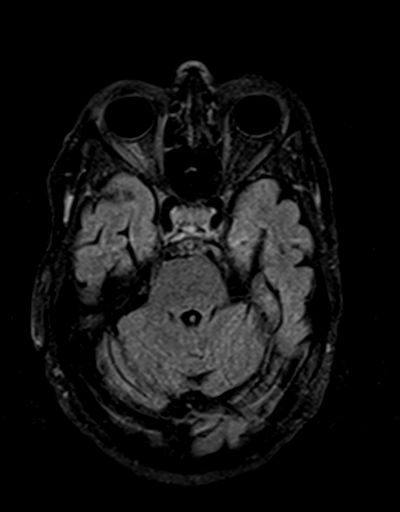
[im 29/43]
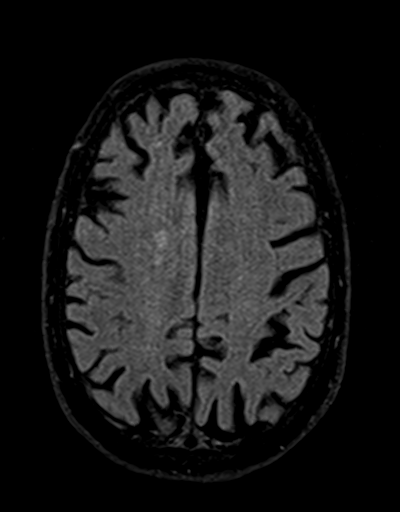
[im 43/43]
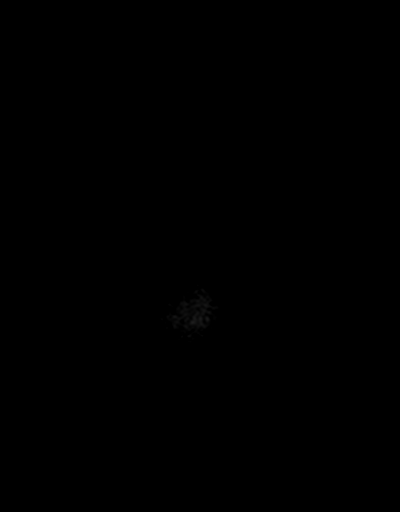

[Series 9: T2 · axial · 5.0mm · 0.90mm/px · z∈[-55,+105]mm · 2 of 24 slices shown (2 of 4)]
[im 1/24]
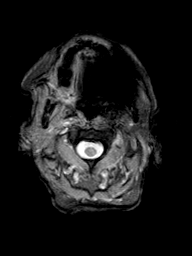
[im 24/24]
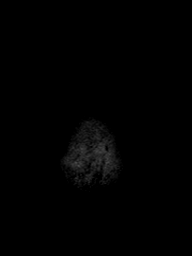

[Series 10: T1 · axial · 1.0mm · 0.90mm/px · z∈[-53,+105]mm · 13 of 160 slices shown (2 of 2)]
[im 1/160]
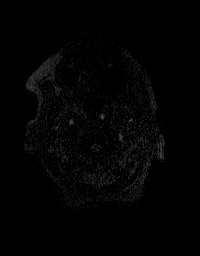
[im 14/160]
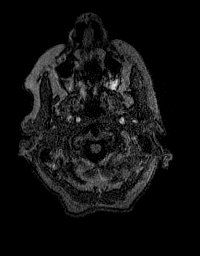
[im 27/160]
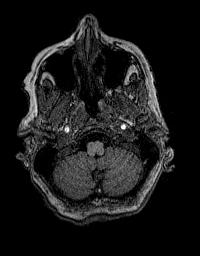
[im 40/160]
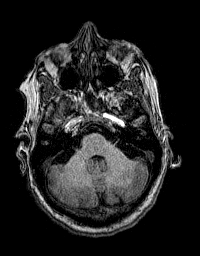
[im 54/160]
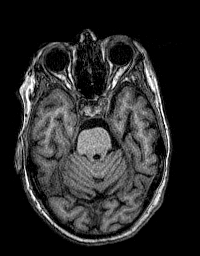
[im 67/160]
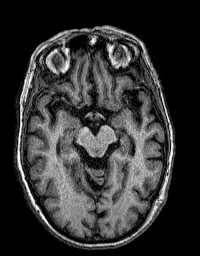
[im 80/160]
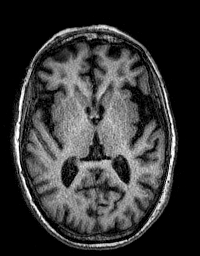
[im 93/160]
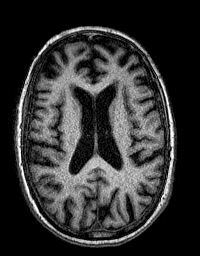
[im 107/160]
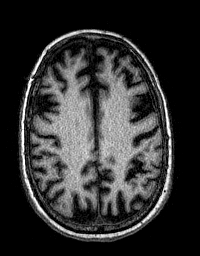
[im 120/160]
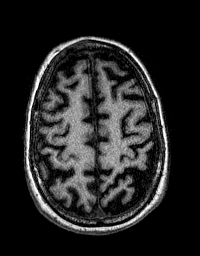
[im 133/160]
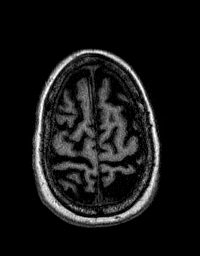
[im 146/160]
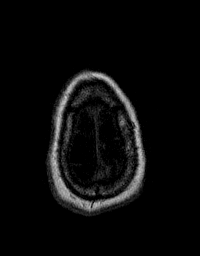
[im 160/160]
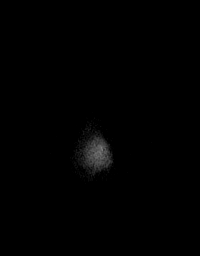

[Series 11: T2 · coronal · 5.0mm · 0.43mm/px · 2 of 29 slices shown (3 of 4)]
[im 1/29]
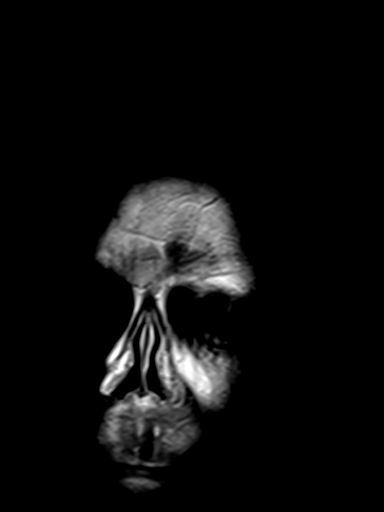
[im 29/29]
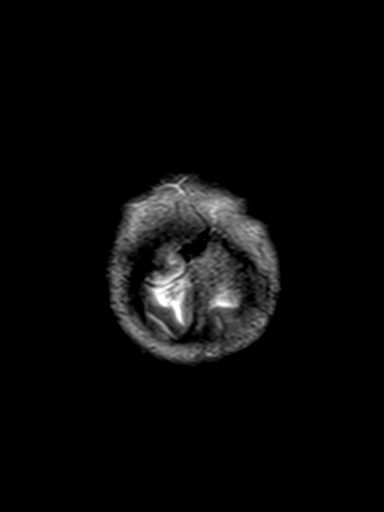

[Series 12: DWI · coronal · 5.0mm · 1.46mm/px · 5 of 64 slices shown (3 of 4)]
[im 1/64]
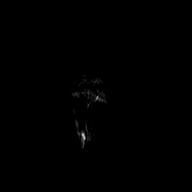
[im 16/64]
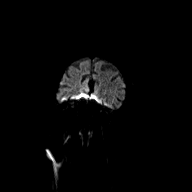
[im 32/64]
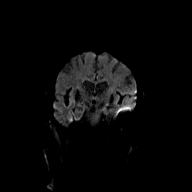
[im 48/64]
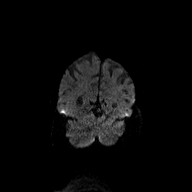
[im 64/64]
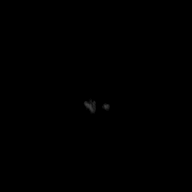

[Series 13: DWI · coronal · 5.0mm · 1.46mm/px · 3 of 32 slices shown (4 of 4)]
[im 1/32]
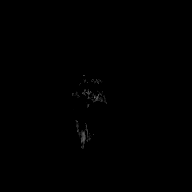
[im 16/32]
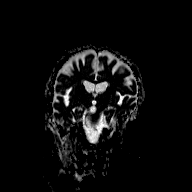
[im 32/32]
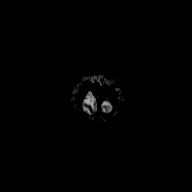

[Series 14: T2 · axial · 5.0mm · 0.90mm/px · z∈[-52,+108]mm · 2 of 24 slices shown (4 of 4)]
[im 1/24]
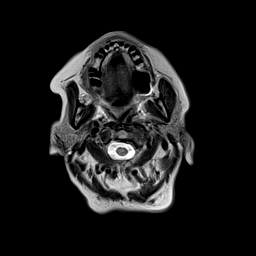
[im 24/24]
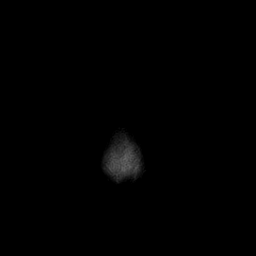

[48 of 48 positions shown; findings below may reference images not displayed]

FINDINGS: Brain:

There is no acute intracranial hemorrhage, extra-axial fluid
collection, or acute infarct.

Parenchymal volume is normal. The ventricles are normal in size.
Gray-white differentiation is preserved. Mild FLAIR signal
abnormality in the subcortical and periventricular white matter is
nonspecific but likely reflects sequela of mild chronic white matter
microangiopathy.

There is no mass lesion. There is no mass effect or midline shift.

Vascular: There is a diminutive right V4 segment flow void in
keeping with findings on the prior CTA. The other intracranial flow
voids are normal.

Skull and upper cervical spine: Normal marrow signal.

Sinuses/Orbits: Mucosal thickening is again seen in the right
sphenoid sinus. Bilateral lens implants are in place. The globes and
orbits are otherwise unremarkable.

Other: None.
IMPRESSION: No acute intracranial pathology.

## 2022-11-01 DIAGNOSIS — M5459 Other low back pain: Secondary | ICD-10-CM | POA: Diagnosis not present

## 2022-11-01 DIAGNOSIS — R2689 Other abnormalities of gait and mobility: Secondary | ICD-10-CM | POA: Diagnosis not present

## 2022-11-01 DIAGNOSIS — M25562 Pain in left knee: Secondary | ICD-10-CM | POA: Diagnosis not present

## 2022-11-02 DIAGNOSIS — R059 Cough, unspecified: Secondary | ICD-10-CM | POA: Diagnosis not present

## 2022-11-08 ENCOUNTER — Other Ambulatory Visit: Payer: Self-pay | Admitting: Family Medicine

## 2022-11-08 DIAGNOSIS — R2689 Other abnormalities of gait and mobility: Secondary | ICD-10-CM | POA: Diagnosis not present

## 2022-11-08 DIAGNOSIS — Z1231 Encounter for screening mammogram for malignant neoplasm of breast: Secondary | ICD-10-CM

## 2022-11-08 DIAGNOSIS — M5459 Other low back pain: Secondary | ICD-10-CM | POA: Diagnosis not present

## 2022-11-08 DIAGNOSIS — M25562 Pain in left knee: Secondary | ICD-10-CM | POA: Diagnosis not present

## 2022-11-15 DIAGNOSIS — R2689 Other abnormalities of gait and mobility: Secondary | ICD-10-CM | POA: Diagnosis not present

## 2022-11-15 DIAGNOSIS — M25562 Pain in left knee: Secondary | ICD-10-CM | POA: Diagnosis not present

## 2022-11-15 DIAGNOSIS — M5459 Other low back pain: Secondary | ICD-10-CM | POA: Diagnosis not present

## 2022-11-22 DIAGNOSIS — M5459 Other low back pain: Secondary | ICD-10-CM | POA: Diagnosis not present

## 2022-11-22 DIAGNOSIS — M25562 Pain in left knee: Secondary | ICD-10-CM | POA: Diagnosis not present

## 2022-11-22 DIAGNOSIS — R2689 Other abnormalities of gait and mobility: Secondary | ICD-10-CM | POA: Diagnosis not present

## 2022-12-06 DIAGNOSIS — M1711 Unilateral primary osteoarthritis, right knee: Secondary | ICD-10-CM | POA: Diagnosis not present

## 2022-12-06 DIAGNOSIS — Z96652 Presence of left artificial knee joint: Secondary | ICD-10-CM | POA: Diagnosis not present

## 2022-12-11 DIAGNOSIS — M5459 Other low back pain: Secondary | ICD-10-CM | POA: Diagnosis not present

## 2022-12-11 DIAGNOSIS — R2689 Other abnormalities of gait and mobility: Secondary | ICD-10-CM | POA: Diagnosis not present

## 2022-12-11 DIAGNOSIS — Z96652 Presence of left artificial knee joint: Secondary | ICD-10-CM | POA: Diagnosis not present

## 2022-12-11 DIAGNOSIS — M6281 Muscle weakness (generalized): Secondary | ICD-10-CM | POA: Diagnosis not present

## 2022-12-11 DIAGNOSIS — M25562 Pain in left knee: Secondary | ICD-10-CM | POA: Diagnosis not present

## 2022-12-11 DIAGNOSIS — M25552 Pain in left hip: Secondary | ICD-10-CM | POA: Diagnosis not present

## 2022-12-13 DIAGNOSIS — M6281 Muscle weakness (generalized): Secondary | ICD-10-CM | POA: Diagnosis not present

## 2022-12-13 DIAGNOSIS — R2689 Other abnormalities of gait and mobility: Secondary | ICD-10-CM | POA: Diagnosis not present

## 2022-12-13 DIAGNOSIS — M5459 Other low back pain: Secondary | ICD-10-CM | POA: Diagnosis not present

## 2022-12-13 DIAGNOSIS — M25552 Pain in left hip: Secondary | ICD-10-CM | POA: Diagnosis not present

## 2022-12-13 DIAGNOSIS — M25562 Pain in left knee: Secondary | ICD-10-CM | POA: Diagnosis not present

## 2022-12-13 DIAGNOSIS — Z96652 Presence of left artificial knee joint: Secondary | ICD-10-CM | POA: Diagnosis not present

## 2022-12-17 DIAGNOSIS — M25552 Pain in left hip: Secondary | ICD-10-CM | POA: Diagnosis not present

## 2022-12-17 DIAGNOSIS — Z96652 Presence of left artificial knee joint: Secondary | ICD-10-CM | POA: Diagnosis not present

## 2022-12-17 DIAGNOSIS — M25562 Pain in left knee: Secondary | ICD-10-CM | POA: Diagnosis not present

## 2022-12-17 DIAGNOSIS — M6281 Muscle weakness (generalized): Secondary | ICD-10-CM | POA: Diagnosis not present

## 2022-12-17 DIAGNOSIS — R2689 Other abnormalities of gait and mobility: Secondary | ICD-10-CM | POA: Diagnosis not present

## 2022-12-17 DIAGNOSIS — M5459 Other low back pain: Secondary | ICD-10-CM | POA: Diagnosis not present

## 2022-12-21 DIAGNOSIS — H26493 Other secondary cataract, bilateral: Secondary | ICD-10-CM | POA: Diagnosis not present

## 2022-12-21 DIAGNOSIS — H04123 Dry eye syndrome of bilateral lacrimal glands: Secondary | ICD-10-CM | POA: Diagnosis not present

## 2022-12-21 DIAGNOSIS — Z961 Presence of intraocular lens: Secondary | ICD-10-CM | POA: Diagnosis not present

## 2022-12-24 DIAGNOSIS — M5459 Other low back pain: Secondary | ICD-10-CM | POA: Diagnosis not present

## 2022-12-24 DIAGNOSIS — Z96652 Presence of left artificial knee joint: Secondary | ICD-10-CM | POA: Diagnosis not present

## 2022-12-24 DIAGNOSIS — M25562 Pain in left knee: Secondary | ICD-10-CM | POA: Diagnosis not present

## 2022-12-24 DIAGNOSIS — M25552 Pain in left hip: Secondary | ICD-10-CM | POA: Diagnosis not present

## 2022-12-24 DIAGNOSIS — M6281 Muscle weakness (generalized): Secondary | ICD-10-CM | POA: Diagnosis not present

## 2022-12-24 DIAGNOSIS — R2689 Other abnormalities of gait and mobility: Secondary | ICD-10-CM | POA: Diagnosis not present

## 2022-12-26 ENCOUNTER — Ambulatory Visit
Admission: RE | Admit: 2022-12-26 | Discharge: 2022-12-26 | Disposition: A | Discharge status: Home / Self Care | Payer: Medicare PPO | Source: Ambulatory Visit | Attending: Family Medicine | Admitting: Family Medicine

## 2022-12-26 DIAGNOSIS — Z79891 Long term (current) use of opiate analgesic: Secondary | ICD-10-CM | POA: Diagnosis not present

## 2022-12-26 DIAGNOSIS — Z79899 Other long term (current) drug therapy: Secondary | ICD-10-CM | POA: Diagnosis not present

## 2022-12-26 DIAGNOSIS — M47814 Spondylosis without myelopathy or radiculopathy, thoracic region: Secondary | ICD-10-CM | POA: Diagnosis not present

## 2022-12-26 DIAGNOSIS — Z1231 Encounter for screening mammogram for malignant neoplasm of breast: Secondary | ICD-10-CM

## 2022-12-26 DIAGNOSIS — M461 Sacroiliitis, not elsewhere classified: Secondary | ICD-10-CM | POA: Diagnosis not present

## 2022-12-26 DIAGNOSIS — M791 Myalgia, unspecified site: Secondary | ICD-10-CM | POA: Diagnosis not present

## 2022-12-26 DIAGNOSIS — G894 Chronic pain syndrome: Secondary | ICD-10-CM | POA: Diagnosis not present

## 2022-12-26 DIAGNOSIS — M545 Low back pain, unspecified: Secondary | ICD-10-CM | POA: Diagnosis not present

## 2022-12-31 DIAGNOSIS — M6281 Muscle weakness (generalized): Secondary | ICD-10-CM | POA: Diagnosis not present

## 2022-12-31 DIAGNOSIS — M25552 Pain in left hip: Secondary | ICD-10-CM | POA: Diagnosis not present

## 2022-12-31 DIAGNOSIS — M5459 Other low back pain: Secondary | ICD-10-CM | POA: Diagnosis not present

## 2022-12-31 DIAGNOSIS — R2689 Other abnormalities of gait and mobility: Secondary | ICD-10-CM | POA: Diagnosis not present

## 2022-12-31 DIAGNOSIS — M25562 Pain in left knee: Secondary | ICD-10-CM | POA: Diagnosis not present

## 2022-12-31 DIAGNOSIS — Z96652 Presence of left artificial knee joint: Secondary | ICD-10-CM | POA: Diagnosis not present

## 2023-01-22 DIAGNOSIS — M25562 Pain in left knee: Secondary | ICD-10-CM | POA: Diagnosis not present

## 2023-01-22 DIAGNOSIS — Z96652 Presence of left artificial knee joint: Secondary | ICD-10-CM | POA: Diagnosis not present

## 2023-01-28 DIAGNOSIS — M47814 Spondylosis without myelopathy or radiculopathy, thoracic region: Secondary | ICD-10-CM | POA: Diagnosis not present

## 2023-02-18 DIAGNOSIS — I10 Essential (primary) hypertension: Secondary | ICD-10-CM | POA: Diagnosis not present

## 2023-02-18 DIAGNOSIS — R238 Other skin changes: Secondary | ICD-10-CM | POA: Diagnosis not present

## 2023-03-06 DIAGNOSIS — M19071 Primary osteoarthritis, right ankle and foot: Secondary | ICD-10-CM | POA: Diagnosis not present

## 2023-03-06 DIAGNOSIS — M19072 Primary osteoarthritis, left ankle and foot: Secondary | ICD-10-CM | POA: Diagnosis not present

## 2023-03-07 DIAGNOSIS — M546 Pain in thoracic spine: Secondary | ICD-10-CM | POA: Diagnosis not present

## 2023-03-07 DIAGNOSIS — M47894 Other spondylosis, thoracic region: Secondary | ICD-10-CM | POA: Diagnosis not present

## 2023-03-12 DIAGNOSIS — M546 Pain in thoracic spine: Secondary | ICD-10-CM | POA: Diagnosis not present

## 2023-03-12 DIAGNOSIS — M47894 Other spondylosis, thoracic region: Secondary | ICD-10-CM | POA: Diagnosis not present

## 2023-03-14 DIAGNOSIS — E7800 Pure hypercholesterolemia, unspecified: Secondary | ICD-10-CM | POA: Diagnosis not present

## 2023-03-14 DIAGNOSIS — R21 Rash and other nonspecific skin eruption: Secondary | ICD-10-CM | POA: Diagnosis not present

## 2023-03-14 DIAGNOSIS — F419 Anxiety disorder, unspecified: Secondary | ICD-10-CM | POA: Diagnosis not present

## 2023-03-14 DIAGNOSIS — M8580 Other specified disorders of bone density and structure, unspecified site: Secondary | ICD-10-CM | POA: Diagnosis not present

## 2023-03-14 DIAGNOSIS — J8410 Pulmonary fibrosis, unspecified: Secondary | ICD-10-CM | POA: Diagnosis not present

## 2023-03-14 DIAGNOSIS — I10 Essential (primary) hypertension: Secondary | ICD-10-CM | POA: Diagnosis not present

## 2023-03-14 DIAGNOSIS — K219 Gastro-esophageal reflux disease without esophagitis: Secondary | ICD-10-CM | POA: Diagnosis not present

## 2023-03-14 DIAGNOSIS — E46 Unspecified protein-calorie malnutrition: Secondary | ICD-10-CM | POA: Diagnosis not present

## 2023-03-19 DIAGNOSIS — M546 Pain in thoracic spine: Secondary | ICD-10-CM | POA: Diagnosis not present

## 2023-03-19 DIAGNOSIS — M47894 Other spondylosis, thoracic region: Secondary | ICD-10-CM | POA: Diagnosis not present

## 2023-03-21 DIAGNOSIS — M546 Pain in thoracic spine: Secondary | ICD-10-CM | POA: Diagnosis not present

## 2023-03-21 DIAGNOSIS — M47894 Other spondylosis, thoracic region: Secondary | ICD-10-CM | POA: Diagnosis not present

## 2023-03-28 DIAGNOSIS — M47814 Spondylosis without myelopathy or radiculopathy, thoracic region: Secondary | ICD-10-CM | POA: Diagnosis not present

## 2023-04-02 DIAGNOSIS — M546 Pain in thoracic spine: Secondary | ICD-10-CM | POA: Diagnosis not present

## 2023-04-02 DIAGNOSIS — M47894 Other spondylosis, thoracic region: Secondary | ICD-10-CM | POA: Diagnosis not present

## 2023-04-03 ENCOUNTER — Other Ambulatory Visit: Payer: Self-pay | Admitting: Orthopaedic Surgery

## 2023-04-03 DIAGNOSIS — M25552 Pain in left hip: Secondary | ICD-10-CM

## 2023-04-03 DIAGNOSIS — M4326 Fusion of spine, lumbar region: Secondary | ICD-10-CM | POA: Diagnosis not present

## 2023-04-03 DIAGNOSIS — M461 Sacroiliitis, not elsewhere classified: Secondary | ICD-10-CM | POA: Diagnosis not present

## 2023-04-03 DIAGNOSIS — Z6821 Body mass index (BMI) 21.0-21.9, adult: Secondary | ICD-10-CM | POA: Diagnosis not present

## 2023-04-05 ENCOUNTER — Encounter: Payer: Self-pay | Admitting: Orthopaedic Surgery

## 2023-04-09 ENCOUNTER — Ambulatory Visit
Admission: RE | Admit: 2023-04-09 | Discharge: 2023-04-09 | Disposition: A | Discharge status: Home / Self Care | Payer: Medicare PPO | Source: Ambulatory Visit | Attending: Orthopaedic Surgery | Admitting: Orthopaedic Surgery

## 2023-04-09 DIAGNOSIS — Z6821 Body mass index (BMI) 21.0-21.9, adult: Secondary | ICD-10-CM

## 2023-04-09 DIAGNOSIS — M4326 Fusion of spine, lumbar region: Secondary | ICD-10-CM

## 2023-04-09 DIAGNOSIS — M25552 Pain in left hip: Secondary | ICD-10-CM | POA: Diagnosis not present

## 2023-04-15 DIAGNOSIS — M461 Sacroiliitis, not elsewhere classified: Secondary | ICD-10-CM | POA: Diagnosis not present

## 2023-04-19 ENCOUNTER — Other Ambulatory Visit: Payer: Medicare PPO

## 2023-04-25 DIAGNOSIS — M4326 Fusion of spine, lumbar region: Secondary | ICD-10-CM | POA: Diagnosis not present

## 2023-04-25 DIAGNOSIS — M5416 Radiculopathy, lumbar region: Secondary | ICD-10-CM | POA: Diagnosis not present

## 2023-04-26 ENCOUNTER — Other Ambulatory Visit: Payer: Self-pay | Admitting: Rehabilitation

## 2023-04-26 DIAGNOSIS — M5416 Radiculopathy, lumbar region: Secondary | ICD-10-CM

## 2023-05-13 ENCOUNTER — Ambulatory Visit
Admission: RE | Admit: 2023-05-13 | Discharge: 2023-05-13 | Disposition: A | Discharge status: Home / Self Care | Payer: Medicare PPO | Source: Ambulatory Visit | Attending: Rehabilitation | Admitting: Rehabilitation

## 2023-05-13 DIAGNOSIS — M5416 Radiculopathy, lumbar region: Secondary | ICD-10-CM

## 2023-05-13 DIAGNOSIS — Z981 Arthrodesis status: Secondary | ICD-10-CM | POA: Diagnosis not present

## 2023-05-13 MED ORDER — GADOPICLENOL 0.5 MMOL/ML IV SOLN: 5.0000 mL | Freq: Once | INTRAVENOUS | Status: DC | PRN

## 2023-05-13 MED ORDER — GADOPICLENOL 0.5 MMOL/ML IV SOLN
5.0000 mL | Freq: Once | INTRAVENOUS | Status: AC | PRN
  Administered 2023-05-13: 5 mL via INTRAVENOUS

## 2023-05-29 DIAGNOSIS — M5416 Radiculopathy, lumbar region: Secondary | ICD-10-CM | POA: Diagnosis not present

## 2023-05-29 DIAGNOSIS — M4326 Fusion of spine, lumbar region: Secondary | ICD-10-CM | POA: Diagnosis not present

## 2023-05-29 DIAGNOSIS — Z6821 Body mass index (BMI) 21.0-21.9, adult: Secondary | ICD-10-CM | POA: Diagnosis not present

## 2023-05-29 DIAGNOSIS — M7918 Myalgia, other site: Secondary | ICD-10-CM | POA: Diagnosis not present

## 2023-06-12 DIAGNOSIS — M7742 Metatarsalgia, left foot: Secondary | ICD-10-CM | POA: Diagnosis not present

## 2023-06-12 DIAGNOSIS — M19072 Primary osteoarthritis, left ankle and foot: Secondary | ICD-10-CM | POA: Diagnosis not present

## 2023-06-26 DIAGNOSIS — M5416 Radiculopathy, lumbar region: Secondary | ICD-10-CM | POA: Diagnosis not present

## 2023-07-17 DIAGNOSIS — M4326 Fusion of spine, lumbar region: Secondary | ICD-10-CM | POA: Diagnosis not present

## 2023-07-17 DIAGNOSIS — M5416 Radiculopathy, lumbar region: Secondary | ICD-10-CM | POA: Diagnosis not present

## 2023-07-29 ENCOUNTER — Other Ambulatory Visit (HOSPITAL_COMMUNITY): Payer: Self-pay | Admitting: Orthopedic Surgery

## 2023-07-29 DIAGNOSIS — M545 Low back pain, unspecified: Secondary | ICD-10-CM

## 2023-07-29 DIAGNOSIS — M5416 Radiculopathy, lumbar region: Secondary | ICD-10-CM

## 2023-07-29 DIAGNOSIS — M4326 Fusion of spine, lumbar region: Secondary | ICD-10-CM

## 2023-07-31 DIAGNOSIS — Z681 Body mass index (BMI) 19 or less, adult: Secondary | ICD-10-CM | POA: Diagnosis not present

## 2023-07-31 DIAGNOSIS — R059 Cough, unspecified: Secondary | ICD-10-CM | POA: Diagnosis not present

## 2023-08-06 DIAGNOSIS — Z6820 Body mass index (BMI) 20.0-20.9, adult: Secondary | ICD-10-CM | POA: Diagnosis not present

## 2023-08-06 DIAGNOSIS — R059 Cough, unspecified: Secondary | ICD-10-CM | POA: Diagnosis not present

## 2023-08-14 ENCOUNTER — Encounter (HOSPITAL_COMMUNITY): Payer: Medicare PPO

## 2023-08-14 ENCOUNTER — Encounter (HOSPITAL_COMMUNITY): Payer: Self-pay

## 2023-08-30 ENCOUNTER — Encounter (HOSPITAL_COMMUNITY)
Admission: RE | Admit: 2023-08-30 | Discharge: 2023-08-30 | Disposition: A | Discharge status: Home / Self Care | Payer: Medicare PPO | Source: Ambulatory Visit | Attending: Orthopedic Surgery | Admitting: Orthopedic Surgery

## 2023-08-30 DIAGNOSIS — M4326 Fusion of spine, lumbar region: Secondary | ICD-10-CM | POA: Diagnosis not present

## 2023-08-30 DIAGNOSIS — Z981 Arthrodesis status: Secondary | ICD-10-CM | POA: Diagnosis not present

## 2023-08-30 DIAGNOSIS — M545 Low back pain, unspecified: Secondary | ICD-10-CM | POA: Diagnosis not present

## 2023-08-30 DIAGNOSIS — M5416 Radiculopathy, lumbar region: Secondary | ICD-10-CM | POA: Insufficient documentation

## 2023-08-30 MED ORDER — TECHNETIUM TC 99M MEDRONATE IV KIT
19.5000 | PACK | Freq: Once | INTRAVENOUS | Status: AC | PRN
  Administered 2023-08-30: 19.5 via INTRAVENOUS

## 2023-09-18 DIAGNOSIS — M5416 Radiculopathy, lumbar region: Secondary | ICD-10-CM | POA: Diagnosis not present

## 2023-10-07 ENCOUNTER — Other Ambulatory Visit (HOSPITAL_COMMUNITY): Payer: Self-pay | Admitting: Physician Assistant

## 2023-10-07 DIAGNOSIS — M25562 Pain in left knee: Secondary | ICD-10-CM

## 2023-10-10 ENCOUNTER — Encounter (HOSPITAL_COMMUNITY)
Admission: RE | Admit: 2023-10-10 | Discharge: 2023-10-10 | Disposition: A | Discharge status: Home / Self Care | Payer: Medicare PPO | Source: Ambulatory Visit | Attending: Physician Assistant | Admitting: Physician Assistant

## 2023-10-10 ENCOUNTER — Encounter (HOSPITAL_COMMUNITY)
Admission: RE | Admit: 2023-10-10 | Discharge: 2023-10-10 | Disposition: A | Discharge status: Home / Self Care | Payer: Medicare PPO | Source: Ambulatory Visit | Attending: Orthopedic Surgery | Admitting: Orthopedic Surgery

## 2023-10-10 DIAGNOSIS — M25562 Pain in left knee: Secondary | ICD-10-CM | POA: Diagnosis not present

## 2023-10-10 DIAGNOSIS — Z471 Aftercare following joint replacement surgery: Secondary | ICD-10-CM | POA: Diagnosis not present

## 2023-10-10 DIAGNOSIS — Z96652 Presence of left artificial knee joint: Secondary | ICD-10-CM | POA: Diagnosis not present

## 2023-10-10 MED ORDER — TECHNETIUM TC 99M MEDRONATE IV KIT
20.0000 | PACK | Freq: Once | INTRAVENOUS | Status: AC | PRN
  Administered 2023-10-10: 20.7 via INTRAVENOUS

## 2023-10-11 DIAGNOSIS — M5416 Radiculopathy, lumbar region: Secondary | ICD-10-CM | POA: Diagnosis not present

## 2023-10-28 DIAGNOSIS — F4542 Pain disorder with related psychological factors: Secondary | ICD-10-CM | POA: Diagnosis not present

## 2023-11-01 DIAGNOSIS — M25562 Pain in left knee: Secondary | ICD-10-CM | POA: Diagnosis not present

## 2023-11-01 DIAGNOSIS — Z96652 Presence of left artificial knee joint: Secondary | ICD-10-CM | POA: Diagnosis not present

## 2023-11-20 ENCOUNTER — Emergency Department (HOSPITAL_BASED_OUTPATIENT_CLINIC_OR_DEPARTMENT_OTHER)
Admission: EM | Admit: 2023-11-20 | Discharge: 2023-11-20 | Disposition: A | Discharge status: Home / Self Care | Attending: Emergency Medicine | Admitting: Emergency Medicine

## 2023-11-20 ENCOUNTER — Encounter (HOSPITAL_BASED_OUTPATIENT_CLINIC_OR_DEPARTMENT_OTHER): Payer: Self-pay

## 2023-11-20 ENCOUNTER — Other Ambulatory Visit: Payer: Self-pay

## 2023-11-20 DIAGNOSIS — I10 Essential (primary) hypertension: Secondary | ICD-10-CM | POA: Diagnosis not present

## 2023-11-20 DIAGNOSIS — Z79899 Other long term (current) drug therapy: Secondary | ICD-10-CM | POA: Insufficient documentation

## 2023-11-20 DIAGNOSIS — R42 Dizziness and giddiness: Secondary | ICD-10-CM | POA: Diagnosis not present

## 2023-11-20 DIAGNOSIS — R112 Nausea with vomiting, unspecified: Secondary | ICD-10-CM | POA: Diagnosis not present

## 2023-11-20 LAB — BASIC METABOLIC PANEL
Anion gap: 8 (ref 5–15)
BUN: 17 mg/dL (ref 8–23)
CO2: 29 mmol/L (ref 22–32)
Calcium: 9.5 mg/dL (ref 8.9–10.3)
Chloride: 102 mmol/L (ref 98–111)
Creatinine, Ser: 0.82 mg/dL (ref 0.44–1.00)
GFR, Estimated: >60 mL/min (ref >60)
Glucose, Bld: 150 mg/dL — ABNORMAL HIGH (ref 70–99)
Potassium: 3.5 mmol/L (ref 3.5–5.1)
Sodium: 139 mmol/L (ref 135–145)

## 2023-11-20 LAB — URINALYSIS, ROUTINE W REFLEX MICROSCOPIC
Bilirubin Urine: NEGATIVE
Glucose, UA: NEGATIVE mg/dL
Hgb urine dipstick: NEGATIVE
Ketones, ur: NEGATIVE mg/dL
Leukocytes,Ua: NEGATIVE
Nitrite: NEGATIVE
Protein, ur: 100 mg/dL — AB
Specific Gravity, Urine: 1.022 (ref 1.005–1.030)
pH: 6 (ref 5.0–8.0)

## 2023-11-20 LAB — HEPATIC FUNCTION PANEL
ALT: 14 U/L (ref 0–44)
AST: 21 U/L (ref 15–41)
Albumin: 4.4 g/dL (ref 3.5–5.0)
Alkaline Phosphatase: 60 U/L (ref 38–126)
Bilirubin, Direct: 0.2 mg/dL (ref 0.0–0.2)
Indirect Bilirubin: 0.8 mg/dL (ref 0.3–0.9)
Total Bilirubin: 1 mg/dL (ref 0.0–1.2)
Total Protein: 6.7 g/dL (ref 6.5–8.1)

## 2023-11-20 LAB — CBC
HCT: 49.9 % — ABNORMAL HIGH (ref 36.0–46.0)
Hemoglobin: 16.5 g/dL — ABNORMAL HIGH (ref 12.0–15.0)
MCH: 31.4 pg (ref 26.0–34.0)
MCHC: 33.1 g/dL (ref 30.0–36.0)
MCV: 95 fL (ref 80.0–100.0)
Platelets: 206 10*3/uL (ref 150–400)
RBC: 5.25 MIL/uL — ABNORMAL HIGH (ref 3.87–5.11)
RDW: 11.9 % (ref 11.5–15.5)
WBC: 4.8 10*3/uL (ref 4.0–10.5)
nRBC: 0 % (ref 0.0–0.2)

## 2023-11-20 LAB — LIPASE, BLOOD: Lipase: 21 U/L (ref 11–51)

## 2023-11-20 LAB — TROPONIN I (HIGH SENSITIVITY): Troponin I (High Sensitivity): 4 ng/L (ref <18)

## 2023-11-20 MED ORDER — HYDRALAZINE HCL 20 MG/ML IJ SOLN
10.0000 mg | Freq: Once | INTRAMUSCULAR | Status: DC
Start: 2023-11-20 — End: 2023-11-20
  Filled 2023-11-20: qty 1

## 2023-11-20 NOTE — ED Provider Notes (Signed)
 [place] EMERGENCY DEPARTMENT AT [facility] Provider Note   CSN: [id number] Arrival date & time: 11/20/23  1250     History  Chief Complaint  Patient presents with   Dizziness   Emesis    Kimberly [name] is a 78 y.o. female presents with complaints of episode of dizziness and 1 episode of emesis.  Dizziness was described as room spinning lasted for few minutes.  No abdominal pain.  No urinary symptoms.  States she had similar symptoms within the past year or 2 when she was evaluated here.  CTA head and neck and MR did not show any significant acute findings.  Reports history of vertigo.  States the symptoms feel very similar.  Denies significant headache, no blurry vision or extremity weakness or numbness.  Chest pain or shortness of breath.  Has a history of hypertension of which she takes metoprolol.  Reports she has been inconsistent with her blood pressure medication recently.   Dizziness Associated symptoms: vomiting   Emesis  Past Medical History:  Diagnosis Date   Anxiety    Arthritis 07/06/11   right thumb   Depression    GERD (gastroesophageal reflux disease)    Hypertension    Trigger finger of right hand    ring finger       Home Medications Prior to Admission medications   Medication Sig Start Date End Date Taking? Authorizing Provider  atorvastatin (LIPITOR) 20 MG tablet  09/28/19   [provider]  hydrALAZINE (APRESOLINE) 10 MG tablet Take 1 tablet (10 mg total) by mouth 3 (three) times daily. 02/08/22   Peter Garter, PA  meloxicam (MOBIC) 7.5 MG tablet TAKE 1 TABLET BY MOUTH EVERY DAY Patient not taking: Reported on 02/08/2022 02/15/21   Vivi Barrack, DPM  metoprolol tartrate (LOPRESSOR) 25 MG tablet Take 1 tablet (25 mg total) by mouth 2 (two) times daily. 07/08/11 02/08/22  Rodolph Bong, MD      Allergies    Cyclobenzaprine, Flexeril [cyclobenzaprine hcl], Levofloxacin, Metaxalone, Skelaxin,  Sulfamethoxazole-trimethoprim, Tizanidine hcl, and Penicillins    Review of Systems   Review of Systems  Gastrointestinal:  Positive for vomiting.  Neurological:  Positive for dizziness.    Physical Exam Updated Vital Signs BP (!) 192/86   Pulse 68   Temp 98 F (36.7 C) (Oral)   Resp 15   Ht 5\' 2"  (1.575 m)   Wt 49 kg   SpO2 96%   BMI 19.75 kg/m  Physical Exam Vitals and nursing note reviewed.  Constitutional:      General: She is not in acute distress.    Appearance: She is well-developed.  HENT:     Head: Normocephalic and atraumatic.  Eyes:     Conjunctiva/sclera: Conjunctivae normal.  Cardiovascular:     Rate and Rhythm: Normal rate and regular rhythm.     Heart sounds: No murmur heard. Pulmonary:     Effort: Pulmonary effort is normal. No respiratory distress.     Breath sounds: Normal breath sounds.  Abdominal:     Palpations: Abdomen is soft.     Comments: Very mild epigastric tenderness, no rebound or guarding, no tenderness to McBurney's point, negative Murphy sign  Musculoskeletal:        General: No swelling.     Cervical back: Neck supple.  Skin:    General: Skin is warm and dry.     Capillary Refill: Capillary refill takes less than 2 seconds.  Neurological:  Mental Status: She is alert.     Comments: Patient is alert and oriented. There is no abnormal phonation. Symmetric smile without facial droop. No pronator drift. Moves all extremities spontaneously. 5/5 strength in upper and lower extremities. No sensation deficit. There is no nystagmus. EOMI, PERRL. Coordination intact with finger to nose and normal ambulation.    Psychiatric:        Mood and Affect: Mood normal.     ED Results / Procedures / Treatments   Labs (all labs ordered are listed, but only abnormal results are displayed) Labs Reviewed  BASIC METABOLIC PANEL - Abnormal; Notable for the following components:      Result Value   Glucose, Bld 150 (*)    All other components  within normal limits  CBC - Abnormal; Notable for the following components:   RBC 5.25 (*)    Hemoglobin 16.5 (*)    HCT 49.9 (*)    All other components within normal limits  URINALYSIS, ROUTINE W REFLEX MICROSCOPIC - Abnormal; Notable for the following components:   Protein, ur 100 (*)    Bacteria, UA RARE (*)    All other components within normal limits  LIPASE, BLOOD  HEPATIC FUNCTION PANEL  TROPONIN I (HIGH SENSITIVITY)    EKG None  Radiology No results found.  Procedures Procedures    Medications Ordered in ED Medications - No data to display  ED Course/ Medical Decision Making/ A&P                                 Medical Decision Making Amount and/or Complexity of Data Reviewed Labs: ordered.   This patient presents to the ED with chief complaint(s) of dizziness and nausea.  The complaint involves an extensive differential diagnosis and also carries with it a high risk of complications and morbidity.   pertinent past medical history as listed in HPI  The differential diagnosis includes  CVA, TIA, hypoglycemia, hypertension, ACS, PE, cholecystitis, gastroenteritis The initial plan is to  Will obtain basic labs, EKG Additional history obtained: Additional history obtained from spouse Records reviewed Care Everywhere/External Records  Initial Assessment:   Patient presents hypertensive 190/106 with complaints of episode of dizziness and emesis earlier today.  On exam she has no neuro deficits, abdomen is mildly tender in the periumbilical region, denies significant abdominal pain.  No diarrhea persistent emesis to suggest gastroenteritis.  No chest pain or shortness of breath to suggest ACS or PE.  Other than episode of dizziness described as room spinning, no other symptoms to suggest CVA or TIA.  No headache, blurry vision, extremity weakness or numbness.  Independent ECG interpretation:  Normal sinus rhythm without ischemic changes  Independent labs  interpretation:  The following labs were independently interpreted:  UA with 100 protein, negative nitrates, negative leukocytes, BMP and CBC without significant abnormality lipase within normal limits, troponin without elevation, hepatic function panel unremarkable  Independent visualization and interpretation of imaging: none  Treatment and Reassessment: Offered hydralazine and to monitor for downtrending BP, patient declines.  Would prefer discharge and to continue home regiment.  BP has remained approximately 180/80 periodic increase to 190 with movement.  Consultations obtained:   none  Disposition:   Patient discharged home. Encouraged on compliance with BP meds and PCP follow up. The patient has been appropriately medically screened and/or stabilized in the ED. I have low suspicion for any other emergent medical condition which would  require further screening, evaluation or treatment in the ED or require inpatient management. At time of discharge the patient is hemodynamically stable and in no acute distress. I have discussed work-up results and diagnosis with patient and answered all questions. Patient is agreeable with discharge plan. We discussed strict return precautions for returning to the emergency department and they verbalized understanding.     Social Determinants of Health:   none  This note was dictated with voice recognition software.  Despite best efforts at proofreading, errors may have occurred which can change the documentation meaning.          Final Clinical Impression(s) / ED Diagnoses Final diagnoses:  Dizziness    Rx / DC Orders ED Discharge Orders     None         Halford Decamp, PA-C 11/20/23 1552    Franne Forts, DO 11/22/23 1545

## 2023-11-20 NOTE — ED Notes (Signed)
 Discharge paperwork given and verbally understood.

## 2023-11-20 NOTE — ED Notes (Signed)
 Spoke with lab to add on trop, hep fx panel, lipase

## 2023-11-20 NOTE — ED Triage Notes (Signed)
 Patient arrives ambulatory to the ED with a sudden onset of dizziness and one occurrence of vomiting this morning. Patient reports no pain.

## 2023-11-20 NOTE — Discharge Instructions (Addendum)
 You were evaluated in the emergency room for an episode of vomiting and dizziness.  Your lab work did not show any significant abnormality.  Your blood pressure was noted to be elevated.  Please be sure to take your blood pressure medication consistently and to follow-up with your primary care doctor within the next week.

## 2023-11-21 DIAGNOSIS — F419 Anxiety disorder, unspecified: Secondary | ICD-10-CM | POA: Diagnosis not present

## 2023-11-21 DIAGNOSIS — I10 Essential (primary) hypertension: Secondary | ICD-10-CM | POA: Diagnosis not present

## 2023-11-27 DIAGNOSIS — Z79891 Long term (current) use of opiate analgesic: Secondary | ICD-10-CM | POA: Diagnosis not present

## 2023-11-27 DIAGNOSIS — G894 Chronic pain syndrome: Secondary | ICD-10-CM | POA: Diagnosis not present

## 2023-11-28 DIAGNOSIS — M4326 Fusion of spine, lumbar region: Secondary | ICD-10-CM | POA: Diagnosis not present

## 2023-11-28 DIAGNOSIS — Z79899 Other long term (current) drug therapy: Secondary | ICD-10-CM | POA: Diagnosis not present

## 2023-11-28 DIAGNOSIS — M791 Myalgia, unspecified site: Secondary | ICD-10-CM | POA: Diagnosis not present

## 2023-11-28 DIAGNOSIS — M5416 Radiculopathy, lumbar region: Secondary | ICD-10-CM | POA: Diagnosis not present

## 2023-12-16 DIAGNOSIS — J0100 Acute maxillary sinusitis, unspecified: Secondary | ICD-10-CM | POA: Diagnosis not present

## 2023-12-23 DIAGNOSIS — H524 Presbyopia: Secondary | ICD-10-CM | POA: Diagnosis not present

## 2023-12-27 DIAGNOSIS — M19072 Primary osteoarthritis, left ankle and foot: Secondary | ICD-10-CM | POA: Diagnosis not present

## 2024-01-01 DIAGNOSIS — M48062 Spinal stenosis, lumbar region with neurogenic claudication: Secondary | ICD-10-CM | POA: Diagnosis not present

## 2024-01-01 DIAGNOSIS — M5416 Radiculopathy, lumbar region: Secondary | ICD-10-CM | POA: Diagnosis not present

## 2024-01-09 DIAGNOSIS — G4762 Sleep related leg cramps: Secondary | ICD-10-CM | POA: Diagnosis not present

## 2024-01-09 DIAGNOSIS — R200 Anesthesia of skin: Secondary | ICD-10-CM | POA: Diagnosis not present

## 2024-01-09 DIAGNOSIS — R202 Paresthesia of skin: Secondary | ICD-10-CM | POA: Diagnosis not present

## 2024-01-14 DIAGNOSIS — E538 Deficiency of other specified B group vitamins: Secondary | ICD-10-CM | POA: Diagnosis not present

## 2024-01-23 DIAGNOSIS — M791 Myalgia, unspecified site: Secondary | ICD-10-CM | POA: Diagnosis not present

## 2024-01-28 DIAGNOSIS — E538 Deficiency of other specified B group vitamins: Secondary | ICD-10-CM | POA: Diagnosis not present

## 2024-02-07 ENCOUNTER — Other Ambulatory Visit: Payer: Self-pay | Admitting: Family Medicine

## 2024-02-07 DIAGNOSIS — Z1231 Encounter for screening mammogram for malignant neoplasm of breast: Secondary | ICD-10-CM

## 2024-02-11 DIAGNOSIS — E538 Deficiency of other specified B group vitamins: Secondary | ICD-10-CM | POA: Diagnosis not present

## 2024-02-14 DIAGNOSIS — R051 Acute cough: Secondary | ICD-10-CM | POA: Diagnosis not present

## 2024-02-18 DIAGNOSIS — R051 Acute cough: Secondary | ICD-10-CM | POA: Diagnosis not present

## 2024-02-21 ENCOUNTER — Emergency Department (HOSPITAL_COMMUNITY)
Admission: EM | Admit: 2024-02-21 | Discharge: 2024-02-21 | Disposition: A | Discharge status: Home / Self Care | Attending: Emergency Medicine | Admitting: Emergency Medicine

## 2024-02-21 DIAGNOSIS — Z87891 Personal history of nicotine dependence: Secondary | ICD-10-CM | POA: Insufficient documentation

## 2024-02-21 DIAGNOSIS — R059 Cough, unspecified: Secondary | ICD-10-CM | POA: Diagnosis not present

## 2024-02-21 DIAGNOSIS — R410 Disorientation, unspecified: Secondary | ICD-10-CM | POA: Diagnosis not present

## 2024-02-21 DIAGNOSIS — I10 Essential (primary) hypertension: Secondary | ICD-10-CM | POA: Diagnosis not present

## 2024-02-21 DIAGNOSIS — F10129 Alcohol abuse with intoxication, unspecified: Secondary | ICD-10-CM | POA: Diagnosis not present

## 2024-02-21 DIAGNOSIS — T50905A Adverse effect of unspecified drugs, medicaments and biological substances, initial encounter: Secondary | ICD-10-CM

## 2024-02-21 DIAGNOSIS — T426X5A Adverse effect of other antiepileptic and sedative-hypnotic drugs, initial encounter: Secondary | ICD-10-CM | POA: Diagnosis not present

## 2024-02-21 DIAGNOSIS — R4182 Altered mental status, unspecified: Secondary | ICD-10-CM | POA: Diagnosis present

## 2024-02-21 LAB — CBC
HCT: 37.2 % (ref 36.0–46.0)
Hemoglobin: 12 g/dL (ref 12.0–15.0)
MCH: 31.1 pg (ref 26.0–34.0)
MCHC: 32.3 g/dL (ref 30.0–36.0)
MCV: 96.4 fL (ref 80.0–100.0)
Platelets: 234 10*3/uL (ref 150–400)
RBC: 3.86 MIL/uL — ABNORMAL LOW (ref 3.87–5.11)
RDW: 13.4 % (ref 11.5–15.5)
WBC: 8.9 10*3/uL (ref 4.0–10.5)
nRBC: 0 % (ref 0.0–0.2)

## 2024-02-21 LAB — COMPREHENSIVE METABOLIC PANEL WITH GFR
ALT: 24 U/L (ref 0–44)
AST: 22 U/L (ref 15–41)
Albumin: 3 g/dL — ABNORMAL LOW (ref 3.5–5.0)
Alkaline Phosphatase: 73 U/L (ref 38–126)
Anion gap: 14 (ref 5–15)
BUN: 17 mg/dL (ref 8–23)
CO2: 22 mmol/L (ref 22–32)
Calcium: 9.2 mg/dL (ref 8.9–10.3)
Chloride: 103 mmol/L (ref 98–111)
Creatinine, Ser: 1.08 mg/dL — ABNORMAL HIGH (ref 0.44–1.00)
GFR, Estimated: 53 mL/min — ABNORMAL LOW (ref >60)
Glucose, Bld: 123 mg/dL — ABNORMAL HIGH (ref 70–99)
Potassium: 3.5 mmol/L (ref 3.5–5.1)
Sodium: 139 mmol/L (ref 135–145)
Total Bilirubin: 1 mg/dL (ref 0.0–1.2)
Total Protein: 6.5 g/dL (ref 6.5–8.1)

## 2024-02-21 LAB — ACETAMINOPHEN LEVEL: Acetaminophen (Tylenol), Serum: <10 ug/mL — ABNORMAL LOW (ref 10–30)

## 2024-02-21 LAB — ETHANOL: Alcohol, Ethyl (B): <15 mg/dL (ref <15)

## 2024-02-21 LAB — SALICYLATE LEVEL: Salicylate Lvl: <7 mg/dL — ABNORMAL LOW (ref 7.0–30.0)

## 2024-02-21 MED ORDER — BENZONATATE 100 MG PO CAPS
100.0000 mg | ORAL_CAPSULE | Freq: Once | ORAL | Status: AC
  Administered 2024-02-21: 100 mg via ORAL
  Filled 2024-02-21: qty 1

## 2024-02-21 NOTE — ED Notes (Addendum)
 Christi from poison control called for update & closed case  [phone]

## 2024-02-21 NOTE — ED Provider Notes (Signed)
 [facility] Emergency Department Provider Note MRN:  [id number]  Arrival date & time: 02/21/24     Chief Complaint   Altered Mental Status   History of Present Illness   Kimberly [name] is a 78 y.o. year-old female with a history of hypertension presenting to the ED with chief complaint of altered mental status.  Report of confusion last night, reportedly had a few alcoholic beverages and was acting more confused or inebriated than normal.  Husband checked her medicines and noticed that far too many of her Phenergan tablets were missing.  Called EMS to make sure she was going to be okay.  Patient has no complaints.  Review of Systems  A thorough review of systems was obtained and all systems are negative except as noted in the HPI and PMH.   Patient's Health History    Past Medical History:  Diagnosis Date   Anxiety    Arthritis 07/06/11   right thumb   Depression    GERD (gastroesophageal reflux disease)    Hypertension    Trigger finger of right hand    ring finger    Past Surgical History:  Procedure Laterality Date   ABDOMINAL HYSTERECTOMY  approx. 2008   BACK SURGERY  2013   lumb fusion   BACK SURGERY  10/2015   BREAST EXCISIONAL BIOPSY Left    benign   BREAST LUMPECTOMY  appro. 1990's   left - benign   COLONOSCOPY     KNEE ARTHROSCOPY     both scopes   SHOULDER ARTHROSCOPY  5/15   right   SHOULDER ARTHROSCOPY WITH ROTATOR CUFF REPAIR AND SUBACROMIAL DECOMPRESSION Right 07/30/2014   Procedure: RIGHT SHOULDER ARTHROSCOPY WITH REVISION ACROMIOPLASTY, DEBRIDEMENT AND REVISION OPEN ROTATOR CUFF REPAIR   ;  Surgeon: LELON JONETTA Shari Mickey., MD;  Location: [facility];  Service: Orthopedics;  Laterality: Right;   TONSILLECTOMY     TRIGGER FINGER RELEASE Right 01/08/2019   Procedure: RIGHT RING FINGER RELEASE TRIGGER FINGER/A-1 PULLEY;  Surgeon: Murrell Kuba, MD;  Location: [facility];  Service: Orthopedics;  Laterality:  Right;  FAB    Family History  Problem Relation Age of Onset   Cancer Brother    Coronary artery disease Father     Social History   Socioeconomic History   Marital status: Married    Spouse name: Not on file   Number of children: Not on file   Years of education: Not on file   Highest education level: Not on file  Occupational History   Not on file  Tobacco Use   Smoking status: Former    Current packs/day: 0.00    Types: Cigarettes    Start date: 09/04/1976    Quit date: 09/05/1991    Years since quitting: 32.4   Smokeless tobacco: Never  Vaping Use   Vaping status: Never Used  Substance and Sexual Activity   Alcohol use: Yes    Alcohol/week: 6.0 standard drinks of alcohol    Types: 6 Standard drinks or equivalent per week    Comment: 2 cocktails per night   Drug use: No   Sexual activity: Yes    Birth control/protection: Post-menopausal    Comment: postmenopausal  Other Topics Concern   Not on file  Social History Narrative   Not on file   Social Drivers of Health   Financial Resource Strain: Low Risk  (01/01/2024)   Received from [facility]   Overall Financial Resource Strain (CARDIA)  Difficulty of Paying Living Expenses: Not hard at all  Food Insecurity: No Food Insecurity (01/01/2024)   Received from [facility]   Hunger Vital Sign    Within the past 12 months, you worried that your food would run out before you got the money to buy more.: Never true    Within the past 12 months, the food you bought just didn't last and you didn't have money to get more.: Never true  Transportation Needs: No Transportation Needs (01/01/2024)   Received from [facility]   PRAPARE - Transportation    Lack of Transportation (Medical): No    Lack of Transportation (Non-Medical): No  Physical Activity: Not on file  Stress: Not on file  Social Connections: Unknown (01/09/2022)   Received from [facility]   Social Network    Social Network: Not on file  Intimate Partner  Violence: Unknown (12/01/2021)   Received from [facility]   HITS    Physically Hurt: Not on file    Insult or Talk Down To: Not on file    Threaten Physical Harm: Not on file    Scream or Curse: Not on file     Physical Exam   Vitals:   02/21/24 0506 02/21/24 0515  BP:  (!) 115/56  Pulse: 77 77  Resp: (!) 22 20  Temp: 98 F (36.7 C)   SpO2: 98% 99%    CONSTITUTIONAL: Well-appearing, NAD NEURO/PSYCH:  Alert and oriented x 3, no focal deficits EYES:  eyes equal and reactive ENT/NECK:  no LAD, no JVD CARDIO: Regular rate, well-perfused, normal S1 and S2 PULM:  CTAB no wheezing or rhonchi GI/GU:  non-distended, non-tender MSK/SPINE:  No gross deformities, no edema SKIN:  no rash, atraumatic   *Additional and/or pertinent findings included in MDM below  Diagnostic and Interventional Summary    EKG Interpretation Date/Time:  Friday February 21 2024 06:20:24 EDT Ventricular Rate:  77 PR Interval:  152 QRS Duration:  82 QT Interval:  384 QTC Calculation: 435 R Axis:   -38  Text Interpretation: Sinus rhythm Left axis deviation Consider anterior infarct Confirmed by Theadore Sharper [phone]) on 02/21/2024 6:52:02 AM       Labs Reviewed  CBC - Abnormal; Notable for the following components:      Result Value   RBC 3.86 (*)    All other components within normal limits  COMPREHENSIVE METABOLIC PANEL WITH GFR - Abnormal; Notable for the following components:   Glucose, Bld 123 (*)    Creatinine, Ser 1.08 (*)    Albumin 3.0 (*)    GFR, Estimated 53 (*)    All other components within normal limits  ACETAMINOPHEN  LEVEL - Abnormal; Notable for the following components:   Acetaminophen  (Tylenol ), Serum <10 (*)    All other components within normal limits  SALICYLATE LEVEL - Abnormal; Notable for the following components:   Salicylate Lvl <7.0 (*)    All other components within normal limits  ETHANOL    No orders to display    Medications - No data to display   Procedures   /  Critical Care Procedures  ED Course and Medical Decision Making  Initial Impression and Ddx Concern for possible Phenergan overdose.  Could be a mixed overdose as well given her access to other medicines and her reported alcohol use.  Obtaining labs, Tylenol  salicylate ethanol levels.  Poison control recommending 6-hour observation.  Past medical/surgical history that increases complexity of ED encounter: None  Interpretation of Diagnostics I  personally reviewed the Laboratory Testing and my interpretation is as follows: No significant blood count or electrolyte disturbance.    Patient Reassessment and Ultimate Disposition/Management     Cardiac monitoring for 6 hours, repeat EKG, anticipating discharge if reassuring observation period.  Signed out to oncoming provider at shift change.  Patient management required discussion with the following services or consulting groups: Poison control  Complexity of Problems Addressed Acute illness or injury that poses threat of life of bodily function  Additional Data Reviewed and Analyzed Further history obtained from: None  Additional Factors Impacting ED Encounter Risk Consideration of hospitalization  Ozell HERO. Theadore, MD [facility] Emergency Medicine [facility] [email or web address]  Final Clinical Impressions(s) / ED Diagnoses     ICD-10-CM   1. Confusion  R41.0       ED Discharge Orders     None        Discharge Instructions Discussed with and Provided to Patient:   Discharge Instructions   None      Theadore Ozell HERO, MD 02/21/24 [phone]

## 2024-02-21 NOTE — ED Triage Notes (Signed)
 Patient BIB EMS for evaluation of confusion. Patient husband reports patient has been having confusion since earlier last night. Husband reports patient had several alcoholic drinks around 5pm. Husband states around 8pm patient took her prescribed promethazine and he later realized that half of the bottle was gone from a prescription filled 2 days ago. Patient is alert and oriented x4 during triage but is having intermittent confusion.

## 2024-02-21 NOTE — ED Provider Notes (Signed)
  Physical Exam  BP 125/75   Pulse 74   Temp 98.4 F (36.9 C) (Oral)   Resp 20   SpO2 96%   Physical Exam Vitals and nursing note reviewed.  HENT:     Head: Normocephalic and atraumatic.   Eyes:     Pupils: Pupils are equal, round, and reactive to light.    Cardiovascular:     Rate and Rhythm: Normal rate and regular rhythm.  Pulmonary:     Effort: Pulmonary effort is normal.     Breath sounds: Normal breath sounds.  Abdominal:     Palpations: Abdomen is soft.     Tenderness: There is no abdominal tenderness.   Skin:    General: Skin is warm and dry.   Neurological:     Mental Status: She is alert.   Psychiatric:        Mood and Affect: Mood normal.     Procedures  Procedures  ED Course / MDM   Clinical Course as of 02/21/24 1356  Fri Feb 21, 2024  1352 Patient has remained stable at this time.  Repeat EKG within normal limits with no QT prolongation.  Appropriate for discharge.  Will instruct her to take Tessalon Perle and stop taking cough syrup with codeine.  She will follow-up with her PCP [MP]    Clinical Course User Index [MP] Pamella Ozell LABOR, DO   Medical Decision Making I, Ozell Pamella DO, have assumed care of this patient from the previous provider pending continued observation for a total of at least 6 hours after suspected ingestion of cough medicine with codeine and Phenergan along with alcohol  Amount and/or Complexity of Data Reviewed Labs: ordered. ECG/medicine tests: ordered.  Risk Prescription drug management.          Pamella Ozell LABOR, DO 02/21/24 1356

## 2024-02-21 NOTE — Discharge Instructions (Signed)
 You were seen in the Emergency Department for confusion which may have been due to the cough syrup you were taking We observed you for over 8 hours and you remained stable.  Your EKG and blood work look okay. Please do not take this medication any more Take Tessalon pearls which you are already prescribed for cough Follow-up with your primary care doctor Return to the Emergency Department for worsening of confusion or any other concerns

## 2024-02-25 DIAGNOSIS — E538 Deficiency of other specified B group vitamins: Secondary | ICD-10-CM | POA: Diagnosis not present

## 2024-02-25 DIAGNOSIS — M19072 Primary osteoarthritis, left ankle and foot: Secondary | ICD-10-CM | POA: Diagnosis not present

## 2024-02-25 DIAGNOSIS — M79672 Pain in left foot: Secondary | ICD-10-CM | POA: Diagnosis not present

## 2024-02-26 ENCOUNTER — Ambulatory Visit

## 2024-03-09 DIAGNOSIS — G544 Lumbosacral root disorders, not elsewhere classified: Secondary | ICD-10-CM | POA: Diagnosis not present

## 2024-03-09 DIAGNOSIS — M791 Myalgia, unspecified site: Secondary | ICD-10-CM | POA: Diagnosis not present

## 2024-03-09 DIAGNOSIS — Z981 Arthrodesis status: Secondary | ICD-10-CM | POA: Diagnosis not present

## 2024-03-11 DIAGNOSIS — F419 Anxiety disorder, unspecified: Secondary | ICD-10-CM | POA: Diagnosis not present

## 2024-03-11 DIAGNOSIS — E7800 Pure hypercholesterolemia, unspecified: Secondary | ICD-10-CM | POA: Diagnosis not present

## 2024-03-11 DIAGNOSIS — K2100 Gastro-esophageal reflux disease with esophagitis, without bleeding: Secondary | ICD-10-CM | POA: Diagnosis not present

## 2024-03-11 DIAGNOSIS — I10 Essential (primary) hypertension: Secondary | ICD-10-CM | POA: Diagnosis not present

## 2024-03-11 DIAGNOSIS — Z0000 Encounter for general adult medical examination without abnormal findings: Secondary | ICD-10-CM | POA: Diagnosis not present

## 2024-03-11 DIAGNOSIS — L2089 Other atopic dermatitis: Secondary | ICD-10-CM | POA: Diagnosis not present

## 2024-03-11 DIAGNOSIS — E538 Deficiency of other specified B group vitamins: Secondary | ICD-10-CM | POA: Diagnosis not present

## 2024-03-11 DIAGNOSIS — M8580 Other specified disorders of bone density and structure, unspecified site: Secondary | ICD-10-CM | POA: Diagnosis not present

## 2024-03-11 DIAGNOSIS — I700 Atherosclerosis of aorta: Secondary | ICD-10-CM | POA: Diagnosis not present

## 2024-03-12 ENCOUNTER — Other Ambulatory Visit (HOSPITAL_BASED_OUTPATIENT_CLINIC_OR_DEPARTMENT_OTHER): Payer: Self-pay | Admitting: Family Medicine

## 2024-03-12 DIAGNOSIS — Z780 Asymptomatic menopausal state: Secondary | ICD-10-CM

## 2024-03-25 DIAGNOSIS — M19072 Primary osteoarthritis, left ankle and foot: Secondary | ICD-10-CM | POA: Diagnosis not present

## 2024-04-29 DIAGNOSIS — M76822 Posterior tibial tendinitis, left leg: Secondary | ICD-10-CM | POA: Diagnosis not present

## 2024-04-29 DIAGNOSIS — M19072 Primary osteoarthritis, left ankle and foot: Secondary | ICD-10-CM | POA: Diagnosis not present

## 2024-05-14 DIAGNOSIS — M76822 Posterior tibial tendinitis, left leg: Secondary | ICD-10-CM | POA: Diagnosis not present

## 2024-05-19 DIAGNOSIS — R238 Other skin changes: Secondary | ICD-10-CM | POA: Diagnosis not present

## 2024-05-19 DIAGNOSIS — I10 Essential (primary) hypertension: Secondary | ICD-10-CM | POA: Diagnosis not present

## 2024-05-19 DIAGNOSIS — Z6820 Body mass index (BMI) 20.0-20.9, adult: Secondary | ICD-10-CM | POA: Diagnosis not present

## 2024-05-19 DIAGNOSIS — D692 Other nonthrombocytopenic purpura: Secondary | ICD-10-CM | POA: Diagnosis not present

## 2024-05-19 DIAGNOSIS — K2100 Gastro-esophageal reflux disease with esophagitis, without bleeding: Secondary | ICD-10-CM | POA: Diagnosis not present

## 2024-05-19 DIAGNOSIS — E538 Deficiency of other specified B group vitamins: Secondary | ICD-10-CM | POA: Diagnosis not present

## 2024-05-27 DIAGNOSIS — M25562 Pain in left knee: Secondary | ICD-10-CM | POA: Diagnosis not present

## 2024-05-27 DIAGNOSIS — M76822 Posterior tibial tendinitis, left leg: Secondary | ICD-10-CM | POA: Diagnosis not present

## 2024-06-10 DIAGNOSIS — M76822 Posterior tibial tendinitis, left leg: Secondary | ICD-10-CM | POA: Diagnosis not present

## 2024-06-25 DIAGNOSIS — Z1330 Encounter for screening examination for mental health and behavioral disorders, unspecified: Secondary | ICD-10-CM | POA: Diagnosis not present

## 2024-06-25 DIAGNOSIS — M791 Myalgia, unspecified site: Secondary | ICD-10-CM | POA: Diagnosis not present

## 2024-06-25 DIAGNOSIS — M5116 Intervertebral disc disorders with radiculopathy, lumbar region: Secondary | ICD-10-CM | POA: Diagnosis not present

## 2024-06-25 DIAGNOSIS — M479 Spondylosis, unspecified: Secondary | ICD-10-CM | POA: Diagnosis not present

## 2024-06-26 DIAGNOSIS — M25572 Pain in left ankle and joints of left foot: Secondary | ICD-10-CM | POA: Diagnosis not present

## 2024-09-14 ENCOUNTER — Other Ambulatory Visit (HOSPITAL_COMMUNITY): Payer: Self-pay | Admitting: Specialist

## 2024-09-14 DIAGNOSIS — Z96652 Presence of left artificial knee joint: Secondary | ICD-10-CM

## 2024-09-24 ENCOUNTER — Encounter (HOSPITAL_COMMUNITY)
Admission: RE | Admit: 2024-09-24 | Discharge: 2024-09-24 | Disposition: A | Discharge status: Home / Self Care | Source: Ambulatory Visit | Attending: Specialist | Admitting: Specialist

## 2024-09-24 DIAGNOSIS — Z96652 Presence of left artificial knee joint: Secondary | ICD-10-CM

## 2024-09-24 MED ORDER — TECHNETIUM TC 99M MEDRONATE IV KIT
20.0000 | PACK | Freq: Once | INTRAVENOUS | Status: AC | PRN
  Administered 2024-09-24: 20.4 via INTRAVENOUS
